# Patient Record
Sex: Male | Born: 1975 | Race: White | Hispanic: No | Marital: Single | Smoking: Current every day smoker
Health system: Southern US, Community
[De-identification: ages and names within clinical notes are randomized; demographics above are authoritative.]

---

## 1998-09-30 ENCOUNTER — Emergency Department (HOSPITAL_COMMUNITY): Admission: EM | Admit: 1998-09-30 | Discharge: 1998-09-30 | Payer: Self-pay | Admitting: Emergency Medicine

## 1998-12-20 ENCOUNTER — Emergency Department (HOSPITAL_COMMUNITY): Admission: EM | Admit: 1998-12-20 | Discharge: 1998-12-20 | Payer: Self-pay | Admitting: Emergency Medicine

## 2000-07-30 ENCOUNTER — Emergency Department (HOSPITAL_COMMUNITY): Admission: EM | Admit: 2000-07-30 | Discharge: 2000-07-30 | Payer: Self-pay | Admitting: *Deleted

## 2000-11-28 ENCOUNTER — Emergency Department (HOSPITAL_COMMUNITY): Admission: EM | Admit: 2000-11-28 | Discharge: 2000-11-28 | Payer: Self-pay | Admitting: Emergency Medicine

## 2000-11-29 ENCOUNTER — Emergency Department (HOSPITAL_COMMUNITY): Admission: EM | Admit: 2000-11-29 | Discharge: 2000-11-29 | Payer: Self-pay | Admitting: Emergency Medicine

## 2001-12-25 ENCOUNTER — Emergency Department (HOSPITAL_COMMUNITY): Admission: EM | Admit: 2001-12-25 | Discharge: 2001-12-25 | Payer: Self-pay | Admitting: Emergency Medicine

## 2002-03-01 ENCOUNTER — Emergency Department (HOSPITAL_COMMUNITY): Admission: EM | Admit: 2002-03-01 | Discharge: 2002-03-01 | Payer: Self-pay | Admitting: Emergency Medicine

## 2002-03-01 ENCOUNTER — Encounter: Payer: Self-pay | Admitting: Emergency Medicine

## 2002-03-14 ENCOUNTER — Emergency Department (HOSPITAL_COMMUNITY): Admission: EM | Admit: 2002-03-14 | Discharge: 2002-03-14 | Payer: Self-pay | Admitting: Emergency Medicine

## 2002-03-14 ENCOUNTER — Encounter: Payer: Self-pay | Admitting: Emergency Medicine

## 2004-02-06 ENCOUNTER — Emergency Department (HOSPITAL_COMMUNITY): Admission: EM | Admit: 2004-02-06 | Discharge: 2004-02-06 | Payer: Self-pay | Admitting: Emergency Medicine

## 2004-02-29 ENCOUNTER — Emergency Department (HOSPITAL_COMMUNITY): Admission: EM | Admit: 2004-02-29 | Discharge: 2004-02-29 | Payer: Self-pay | Admitting: Emergency Medicine

## 2004-05-31 ENCOUNTER — Emergency Department (HOSPITAL_COMMUNITY): Admission: EM | Admit: 2004-05-31 | Discharge: 2004-06-01 | Payer: Self-pay | Admitting: Emergency Medicine

## 2006-08-14 ENCOUNTER — Encounter: Admission: RE | Admit: 2006-08-14 | Discharge: 2006-08-14 | Payer: Self-pay | Admitting: Occupational Medicine

## 2007-07-13 ENCOUNTER — Emergency Department (HOSPITAL_COMMUNITY): Admission: EM | Admit: 2007-07-13 | Discharge: 2007-07-13 | Payer: Self-pay | Admitting: Emergency Medicine

## 2007-11-26 ENCOUNTER — Emergency Department (HOSPITAL_COMMUNITY): Admission: EM | Admit: 2007-11-26 | Discharge: 2007-11-26 | Payer: Self-pay | Admitting: Emergency Medicine

## 2008-06-06 ENCOUNTER — Emergency Department (HOSPITAL_COMMUNITY): Admission: EM | Admit: 2008-06-06 | Discharge: 2008-06-06 | Payer: Self-pay | Admitting: Emergency Medicine

## 2008-08-06 ENCOUNTER — Ambulatory Visit: Payer: Self-pay | Admitting: Diagnostic Radiology

## 2008-08-06 ENCOUNTER — Emergency Department (HOSPITAL_BASED_OUTPATIENT_CLINIC_OR_DEPARTMENT_OTHER): Admission: EM | Admit: 2008-08-06 | Discharge: 2008-08-06 | Payer: Self-pay | Admitting: Emergency Medicine

## 2008-08-17 ENCOUNTER — Emergency Department (HOSPITAL_COMMUNITY): Admission: EM | Admit: 2008-08-17 | Discharge: 2008-08-17 | Payer: Self-pay | Admitting: Emergency Medicine

## 2008-10-06 ENCOUNTER — Emergency Department (HOSPITAL_BASED_OUTPATIENT_CLINIC_OR_DEPARTMENT_OTHER): Admission: EM | Admit: 2008-10-06 | Discharge: 2008-10-06 | Payer: Self-pay | Admitting: Emergency Medicine

## 2009-11-18 ENCOUNTER — Emergency Department (HOSPITAL_COMMUNITY): Admission: EM | Admit: 2009-11-18 | Discharge: 2009-11-18 | Payer: Self-pay | Admitting: Emergency Medicine

## 2011-03-16 ENCOUNTER — Emergency Department (HOSPITAL_COMMUNITY)
Admission: EM | Admit: 2011-03-16 | Discharge: 2011-03-16 | Disposition: A | Discharge status: Home / Self Care | Payer: Self-pay | Attending: Emergency Medicine | Admitting: Emergency Medicine

## 2011-03-16 DIAGNOSIS — K029 Dental caries, unspecified: Secondary | ICD-10-CM | POA: Insufficient documentation

## 2011-03-16 DIAGNOSIS — K089 Disorder of teeth and supporting structures, unspecified: Secondary | ICD-10-CM | POA: Insufficient documentation

## 2011-08-23 ENCOUNTER — Encounter (HOSPITAL_COMMUNITY): Payer: Self-pay | Admitting: Emergency Medicine

## 2011-08-23 ENCOUNTER — Emergency Department (HOSPITAL_COMMUNITY): Payer: Self-pay

## 2011-08-23 ENCOUNTER — Other Ambulatory Visit: Payer: Self-pay

## 2011-08-23 ENCOUNTER — Emergency Department (HOSPITAL_COMMUNITY)
Admission: EM | Admit: 2011-08-23 | Discharge: 2011-08-23 | Disposition: A | Discharge status: Home / Self Care | Payer: Self-pay | Attending: Emergency Medicine | Admitting: Emergency Medicine

## 2011-08-23 DIAGNOSIS — J029 Acute pharyngitis, unspecified: Secondary | ICD-10-CM | POA: Insufficient documentation

## 2011-08-23 DIAGNOSIS — J069 Acute upper respiratory infection, unspecified: Secondary | ICD-10-CM | POA: Insufficient documentation

## 2011-08-23 DIAGNOSIS — R059 Cough, unspecified: Secondary | ICD-10-CM | POA: Insufficient documentation

## 2011-08-23 DIAGNOSIS — J3489 Other specified disorders of nose and nasal sinuses: Secondary | ICD-10-CM | POA: Insufficient documentation

## 2011-08-23 DIAGNOSIS — R0602 Shortness of breath: Secondary | ICD-10-CM | POA: Insufficient documentation

## 2011-08-23 DIAGNOSIS — F17200 Nicotine dependence, unspecified, uncomplicated: Secondary | ICD-10-CM | POA: Insufficient documentation

## 2011-08-23 DIAGNOSIS — R05 Cough: Secondary | ICD-10-CM | POA: Insufficient documentation

## 2011-08-23 MED ORDER — ALBUTEROL SULFATE (5 MG/ML) 0.5% IN NEBU
5.0000 mg | INHALATION_SOLUTION | Freq: Once | RESPIRATORY_TRACT | Status: AC
  Administered 2011-08-23: 5 mg via RESPIRATORY_TRACT
  Filled 2011-08-23: qty 1

## 2011-08-23 MED ORDER — AZITHROMYCIN 250 MG PO TABS: ORAL_TABLET | ORAL | Status: AC

## 2011-08-23 MED ORDER — IPRATROPIUM BROMIDE 0.02 % IN SOLN
0.5000 mg | Freq: Once | RESPIRATORY_TRACT | Status: AC
  Administered 2011-08-23: 0.5 mg via RESPIRATORY_TRACT
  Filled 2011-08-23: qty 2.5

## 2011-08-23 MED ORDER — ALBUTEROL SULFATE HFA 108 (90 BASE) MCG/ACT IN AERS
2.0000 | INHALATION_SPRAY | RESPIRATORY_TRACT | Status: DC | PRN
  Administered 2011-08-23: 2 via RESPIRATORY_TRACT
  Filled 2011-08-23: qty 6.7

## 2011-08-23 NOTE — ED Notes (Signed)
Pt here from home with c/o cough, sob and some chest pain, pt is a smoker

## 2011-08-23 NOTE — ED Provider Notes (Signed)
History     CSN: [id number]  Arrival date & time 08/23/11  0920   First MD Initiated Contact with Patient 08/23/11 0945      Chief Complaint  Patient presents with  . Shortness of Breath    (Consider location/radiation/quality/duration/timing/severity/associated sxs/prior treatment) Patient is a 36 y.o. male presenting with shortness of breath. The history is provided by the patient.  Shortness of Breath  The current episode started 3 to 5 days ago. The problem occurs frequently. The problem has been gradually worsening. The problem is moderate. The symptoms are aggravated by a supine position. Associated symptoms include rhinorrhea, sore throat, cough and shortness of breath. Pertinent negatives include no chest pain, no fever and no wheezing. There was no intake of a foreign body. Past medical history comments: Remote history of asthma as a child.Marland Kitchen    History reviewed. No pertinent past medical history.  History reviewed. No pertinent past surgical history.  History reviewed. No pertinent family history.  History  Substance Use Topics  . Smoking status: Current Everyday Smoker -- 1.0 packs/day  . Smokeless tobacco: Not on file  . Alcohol Use: No      Review of Systems  Constitutional: Positive for fatigue. Negative for fever and chills.       Complains of night sweats.  HENT: Positive for sore throat and rhinorrhea.   Respiratory: Positive for cough and shortness of breath. Negative for wheezing.   Cardiovascular: Negative.  Negative for chest pain.  Gastrointestinal: Negative.   Musculoskeletal: Negative.   Skin: Negative.   Neurological: Negative.     Allergies  Review of patient's allergies indicates no known allergies.  Home Medications   Current Outpatient Rx  Name Route Sig Dispense Refill  . NAPROXEN SODIUM 220 MG PO TABS Oral Take 440 mg by mouth daily as needed. For pain    . OVER THE COUNTER MEDICATION Oral Take 2 tablets by mouth 2 (two) times daily  as needed. Tylenol Cold & Flu Tablets  For cold & congestion      BP 140/97  Pulse 76  Temp(Src) 97.9 F (36.6 C) (Oral)  Resp 18  SpO2 98%  Physical Exam  Constitutional: He appears well-developed and well-nourished.  HENT:  Head: Normocephalic.  Neck: Normal range of motion. Neck supple.  Cardiovascular: Normal rate and regular rhythm.   Pulmonary/Chest: Effort normal and breath sounds normal. He exhibits no tenderness.  Abdominal: Soft. Bowel sounds are normal. There is no tenderness. There is no rebound and no guarding.  Musculoskeletal: Normal range of motion.  Neurological: He is alert. No cranial nerve deficit.  Skin: Skin is warm and dry. No rash noted.  Psychiatric: He has a normal mood and affect.    ED Course  Procedures (including critical care time) Feels better after breathing treatment. Has sinus tenderness, cough causing chest wall pain. Will treat with z-pack, encourage smoking cessation.  Labs Reviewed - No data to display Dg Chest 2 View  08/23/2011  *RADIOLOGY REPORT*  Clinical Data: Cough for 3 weeks.  Shortness of breath.  Chest tightness.  CHEST - 2 VIEW  Comparison: None.  Findings: Lungs are clear.  Heart size is normal.  No pneumothorax or pleural effusion.  No focal bony abnormality.  IMPRESSION: Negative chest.  Original Report Authenticated By: Bernadene Bell. Maricela Curet, M.D.     No diagnosis found.    MDM          Rodena Medin, PA-C 08/23/11 1141

## 2011-08-23 NOTE — ED Notes (Signed)
Discharge instructions reviewed with pt; verbalizes understanding.  No questions asked; no further c/o's voiced.  Ambulatory to lobby. NAD noted. 

## 2011-08-23 NOTE — ED Provider Notes (Signed)
Medical screening examination/treatment/procedure(s) were performed by non-physician practitioner and as supervising physician I was immediately available for consultation/collaboration.  Flint Melter, MD 08/23/11 1945

## 2011-12-25 ENCOUNTER — Emergency Department (HOSPITAL_COMMUNITY)
Admission: EM | Admit: 2011-12-25 | Discharge: 2011-12-25 | Disposition: A | Discharge status: Home / Self Care | Payer: Self-pay | Attending: Emergency Medicine | Admitting: Emergency Medicine

## 2011-12-25 ENCOUNTER — Encounter (HOSPITAL_COMMUNITY): Payer: Self-pay

## 2011-12-25 DIAGNOSIS — H9209 Otalgia, unspecified ear: Secondary | ICD-10-CM | POA: Insufficient documentation

## 2011-12-25 DIAGNOSIS — F17200 Nicotine dependence, unspecified, uncomplicated: Secondary | ICD-10-CM | POA: Insufficient documentation

## 2011-12-25 MED ORDER — NEOMYCIN-POLYMYXIN-HC 3.5-10000-1 OT SUSP: 3.0000 [drp] | Freq: Three times a day (TID) | OTIC | Status: AC

## 2011-12-25 NOTE — ED Notes (Signed)
Patient presents with right ear pain, dizziness and decrease in hearing x 5 days.  Patient has had a recent sinus infection.   OTC meds not working for pain.

## 2011-12-25 NOTE — ED Provider Notes (Signed)
History    This chart was scribed for Gerald Gaskins, MD, MD by Gerald Li. The patient was seen in room STRE4 and the patient's care was started at 6:13PM.   CSN: [id number]  Arrival date & time 12/25/11  1757   First MD Initiated Contact with Patient 12/25/11 1804      Chief Complaint  Patient presents with  . Otalgia     Patient is a 36 y.o. male presenting with ear pain. The history is provided by the patient.  Otalgia   Gerald Li is a 36 y.o. male who presents to the Emergency Department complaining of intermittent moderate right otalgia onset 5 days ago. Pt reports having nausea and dizziness. He states that he might have had fevers. Denies LOC. He reports the pain is sharp. He has tried OTC medication without relief. He reports having sinus infection and dental pain that he has had for a couple of weeks that might be the cause of his ear pain. There is no radiation of pain. 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  PMH - none  History reviewed. No pertinent past surgical history.  No family history on file.  History  Substance Use Topics  . Smoking status: Current Everyday Smoker -- 1.0 packs/day  . Smokeless tobacco: Not on file  . Alcohol Use: No      Review of Systems  Constitutional: Negative for fever.  HENT: Positive for ear pain.     Allergies  Review of patient's allergies indicates no known allergies.  Home Medications    BP 130/80  Pulse 92  Temp(Src) 98.2 F (36.8 C) (Oral)  Resp 16  SpO2 97%  Physical Exam  Nursing note and vitals reviewed.  CONSTITUTIONAL: Well developed/well nourished HEAD AND FACE: Normocephalic/atraumatic EYES: EOMI/PERRL ENMT: Mucous membranes moist, right tm occluded by cerumen and drainage, ears symmetric NECK: supple no meningeal signs CV: S1/S2 noted, no murmurs/rubs/gallops noted LUNGS: Lungs are  clear to auscultation bilaterally, no apparent distress ABDOMEN: soft, nontender, no rebound or guarding NEURO: Pt is awake/alert, moves all extremitiesx4 EXTREMITIES: pulses normal, full ROM SKIN: warm, color normal PSYCH: no abnormalities of mood noted  ED Course  Procedures DIAGNOSTIC STUDIES: Oxygen Saturation is 97% on room air, normal by my interpretation.    COORDINATION OF CARE: 6:15PM EDP discusses pt ED treatment with pt.   Given his pain, drainage, could have otitis externa, will start on topical abx.  Pt otherwise well appearing, no focal neuro deficits, he is nontoxic in appearance    MDM  Nursing notes including past medical history, social history and family history reviewed and considered in documentation   I personally performed the services described in this documentation, which was scribed in my presence. The recorded information has been reviewed and considered.         Gerald Gaskins, MD 12/25/11 Gerald Li

## 2012-05-14 ENCOUNTER — Emergency Department (HOSPITAL_COMMUNITY)
Admission: EM | Admit: 2012-05-14 | Discharge: 2012-05-14 | Disposition: A | Discharge status: Home / Self Care | Payer: Self-pay | Attending: Emergency Medicine | Admitting: Emergency Medicine

## 2012-05-14 ENCOUNTER — Encounter (HOSPITAL_COMMUNITY): Payer: Self-pay

## 2012-05-14 DIAGNOSIS — F17200 Nicotine dependence, unspecified, uncomplicated: Secondary | ICD-10-CM | POA: Insufficient documentation

## 2012-05-14 DIAGNOSIS — K089 Disorder of teeth and supporting structures, unspecified: Secondary | ICD-10-CM | POA: Insufficient documentation

## 2012-05-14 DIAGNOSIS — K0889 Other specified disorders of teeth and supporting structures: Secondary | ICD-10-CM

## 2012-05-14 MED ORDER — PENICILLIN V POTASSIUM 500 MG PO TABS: 500.0000 mg | ORAL_TABLET | Freq: Three times a day (TID) | ORAL | Status: DC

## 2012-05-14 MED ORDER — HYDROCODONE-ACETAMINOPHEN 5-500 MG PO TABS: 1.0000 | ORAL_TABLET | Freq: Four times a day (QID) | ORAL | Status: DC | PRN

## 2012-05-14 NOTE — ED Provider Notes (Signed)
History     CSN: [id number]  Arrival date & time 05/14/12  2049   First MD Initiated Contact with Patient 05/14/12 2130      Chief Complaint  Patient presents with  . Dental Pain    (Consider location/radiation/quality/duration/timing/severity/associated sxs/prior treatment) HPI  36 year old male presents complaining of dental pain. Patient reports that he has very poor dentition. He has been seen at the dental free clinic in the past and was recommended to have antibiotics if he would like to have his teeth pulled. He is scheduled to followup at the free dental clinic on November 12. However for the past 2 weeks he has been having increasing dental pain. Pain is worsened on his right upper jaw. Describe pain as a sharp and throbbing sensation worsening with eating drinking hot or cold water or with air exposure. Onset is gradual, intermittent, nonradiating, moderate in severity, worsening with eating.  he denies fever, chills, or rash.  patient is a smoker.  Patient has been taking a moderate amount of ibuprofen for pain which provide some relief.  History reviewed. No pertinent past medical history.  History reviewed. No pertinent past surgical history.  History reviewed. No pertinent family history.  History  Substance Use Topics  . Smoking status: Current Every Day Smoker -- 1.0 packs/day  . Smokeless tobacco: Not on file  . Alcohol Use: No      Review of Systems  Constitutional: Negative for fever.  HENT: Positive for dental problem. Negative for sore throat and trouble swallowing.   Skin: Negative for rash.  Neurological: Negative for numbness.    Allergies  Review of patient's allergies indicates no known allergies.  Home Medications   Current Outpatient Rx  Name Route Sig Dispense Refill  . IBUPROFEN 200 MG PO TABS Oral Take 800 mg by mouth every 6 (six) hours as needed. For pain; Dosage per pt      BP 132/93  Pulse 82  Temp 97.8 F (36.6 C) (Oral)   Resp 18  SpO2 97%  Physical Exam  Nursing note and vitals reviewed. Constitutional: He appears well-developed and well-nourished. No distress.  HENT:  Head: Normocephalic and atraumatic.  Right Ear: External ear normal.  Left Ear: External ear normal.  Nose: Nose normal.  Mouth/Throat: Oropharynx is clear and moist. No oropharyngeal exudate.       Poor dentition. Significant dental decay on left upper premolar, her left lower molar, and right upper canine.  Gingititis noted to L lower molar, ttp.  No evidence of abscess noted.   Eyes: Conjunctivae normal are normal.  Neck: Normal range of motion. Neck supple.  Cardiovascular: Normal rate and regular rhythm.   Pulmonary/Chest: Effort normal and breath sounds normal.  Lymphadenopathy:    He has no cervical adenopathy.  Neurological: He is alert.  Skin: Skin is warm. No rash noted.  Psychiatric: He has a normal mood and affect.    ED Course  Procedures (including critical care time)  Labs Reviewed - No data to display No results found.   No diagnosis found.  1. Dental pain  MDM  Pt with poor dental decay with increase dental pain.  No evidence of deep tissue infection amenable to I&D.  Is afebrile.  Will prescribe abx, pain meds and referral to dentist for further care.  Smoking cessation discussed.    BP 132/93  Pulse 82  Temp 97.8 F (36.6 C) (Oral)  Resp 18  SpO2 97%        Greta Doom  Laveda Norman, PA-C 05/14/12 2202

## 2012-05-14 NOTE — ED Notes (Signed)
Provider at bedside

## 2012-05-14 NOTE — ED Notes (Signed)
Pt reports pain to all of his upper and lower teeth w/increase pain to (L) upper area x2 weeks. Pt denies having a dentist, reports he usually attends the free dental clinic that is offered every year, they informed him last year he would need ABX treatment prior to having any teeth pulled this year to prevent further abscess d/t :heavy" dental carries.

## 2012-05-16 NOTE — ED Provider Notes (Signed)
Medical screening examination/treatment/procedure(s) were performed by non-physician practitioner and as supervising physician I was immediately available for consultation/collaboration.  Raeford Razor, MD 05/16/12 0040

## 2014-04-26 ENCOUNTER — Encounter (HOSPITAL_COMMUNITY): Payer: Self-pay | Admitting: Emergency Medicine

## 2014-04-26 ENCOUNTER — Emergency Department (HOSPITAL_COMMUNITY)
Admission: EM | Admit: 2014-04-26 | Discharge: 2014-04-26 | Disposition: A | Discharge status: Home / Self Care | Payer: Self-pay | Attending: Emergency Medicine | Admitting: Emergency Medicine

## 2014-04-26 ENCOUNTER — Emergency Department (HOSPITAL_COMMUNITY): Payer: Self-pay

## 2014-04-26 DIAGNOSIS — Z720 Tobacco use: Secondary | ICD-10-CM | POA: Insufficient documentation

## 2014-04-26 DIAGNOSIS — K047 Periapical abscess without sinus: Secondary | ICD-10-CM | POA: Insufficient documentation

## 2014-04-26 DIAGNOSIS — K029 Dental caries, unspecified: Secondary | ICD-10-CM | POA: Insufficient documentation

## 2014-04-26 DIAGNOSIS — Z792 Long term (current) use of antibiotics: Secondary | ICD-10-CM | POA: Insufficient documentation

## 2014-04-26 DIAGNOSIS — K0381 Cracked tooth: Secondary | ICD-10-CM | POA: Insufficient documentation

## 2014-04-26 MED ORDER — HYDROCODONE-ACETAMINOPHEN 5-500 MG PO TABS: 1.0000 | ORAL_TABLET | Freq: Four times a day (QID) | ORAL | Status: DC | PRN

## 2014-04-26 MED ORDER — PENICILLIN V POTASSIUM 500 MG PO TABS: 500.0000 mg | ORAL_TABLET | Freq: Four times a day (QID) | ORAL | Status: DC

## 2014-04-26 MED ORDER — IBUPROFEN 800 MG PO TABS: 800.0000 mg | ORAL_TABLET | Freq: Four times a day (QID) | ORAL | Status: DC | PRN

## 2014-04-26 NOTE — ED Notes (Signed)
Pt. reports left upper dental pain with swelling onset last week .

## 2014-04-26 NOTE — Discharge Instructions (Signed)
Follow-up with the dentist provided.  Return here as needed. 

## 2014-04-26 NOTE — ED Provider Notes (Signed)
CSN: [id number]     Arrival date & time 04/26/14  1904 History  This chart was scribed for non-physician practitioner, Ebbie Ridgehris Derrian Rodak, PA-C working with Raeford RazorStephen Kohut, MD by Greggory StallionKayla Andersen, ED scribe. This patient was seen in room TR05C/TR05C and the patient's care was started at 7:49 PM.   Chief Complaint  Patient presents with  . Dental Pain   The history is provided by the patient. No language interpreter was used.   HPI Comments: Vladimir CroftsDaniel W River is a 38 y.o. male who presents to the Emergency Department complaining of gradual onset left upper dental pain that started one week ago. Reports associated swelling around the area. There has been some drainage. He has rinsed with peroxide and water with little relief. Reports fever of 101 yesterday that was relieved with tylenol. Pt states he was waiting until the 16th to go to a free dental clinic. States he is also having substernal chest pain with inhalation.   History reviewed. No pertinent past medical history. History reviewed. No pertinent past surgical history. No family history on file. History  Substance Use Topics  . Smoking status: Current Every Day Smoker -- 1.00 packs/day  . Smokeless tobacco: Not on file  . Alcohol Use: No    Review of Systems All other systems negative except as documented in the HPI. All pertinent positives and negatives as reviewed in the HPI.  Allergies  Review of patient's allergies indicates no known allergies.  Home Medications   Prior to Admission medications   Medication Sig Start Date End Date Taking? Authorizing Provider  HYDROcodone-acetaminophen (VICODIN) 5-500 MG per tablet Take 1-2 tablets by mouth every 6 (six) hours as needed for pain. 05/14/12   Fayrene HelperBowie Tran, PA-C  ibuprofen (ADVIL,MOTRIN) 200 MG tablet Take 800 mg by mouth every 6 (six) hours as needed. For pain; Dosage per pt    Historical Provider, MD  penicillin v potassium (VEETID) 500 MG tablet Take 1 tablet (500 mg total) by mouth 3  (three) times daily. 05/14/12   Fayrene HelperBowie Tran, PA-C   BP 147/92  Pulse 86  Temp(Src) 98.7 F (37.1 C) (Oral)  Resp 18  Ht 5\' 10"  (1.778 m)  Wt 235 lb (106.595 kg)  BMI 33.72 kg/m2  SpO2 97%  Physical Exam  Nursing note and vitals reviewed. Constitutional: He is oriented to person, place, and time. He appears well-developed and well-nourished. No distress.  HENT:  Head: Normocephalic and atraumatic.  Multiple broken teeth off the gumline. Bad dental decay.   Neck: Normal range of motion. Neck supple. No tracheal deviation present.  Cardiovascular: Normal rate, regular rhythm and normal heart sounds.   Pulmonary/Chest: Effort normal and breath sounds normal. No respiratory distress. He has no wheezes. He has no rales.  Musculoskeletal: Normal range of motion. He exhibits no edema.  Neurological: He is alert and oriented to person, place, and time.  Skin: Skin is warm and dry.  Psychiatric: He has a normal mood and affect. His behavior is normal.    ED Course  Procedures (including critical care time)  DIAGNOSTIC STUDIES: Oxygen Saturation is 97% on RA, normal by my interpretation.    COORDINATION OF CARE: 7:51 PM-Discussed treatment plan which includes chest xray, pain medication and an antibiotic with pt at bedside and pt agreed to plan. Will give pt dental referrals and advised him to try to follow up before the dental clinic.   Labs Review Labs Reviewed - No data to display  Imaging Review Dg Chest 2  View  04/26/2014   CLINICAL DATA:  Cough and chest pain for 3 days.  Initial encounter.  EXAM: CHEST  2 VIEW  COMPARISON:  08/23/2011  FINDINGS: Normal heart size and mediastinal contours. No acute infiltrate or edema. No effusion or pneumothorax. No acute osseous findings.  IMPRESSION: No active cardiopulmonary disease.   Electronically Signed   By: Tiburcio PeaJonathan  Watts M.D.   On: 04/26/2014 21:00      I personally performed the services described in this documentation, which was  scribed in my presence. The recorded information has been reviewed and is accurate.  Carlyle Dollyhristopher W Asher Torpey, PA-C 04/26/14 2109

## 2014-04-29 NOTE — ED Provider Notes (Signed)
Medical screening examination/treatment/procedure(s) were performed by non-physician practitioner and as supervising physician I was immediately available for consultation/collaboration.   EKG Interpretation None       Errick Salts, MD 04/29/14 1904 

## 2014-10-21 ENCOUNTER — Emergency Department (HOSPITAL_BASED_OUTPATIENT_CLINIC_OR_DEPARTMENT_OTHER)
Admission: EM | Admit: 2014-10-21 | Discharge: 2014-10-21 | Disposition: A | Discharge status: Home / Self Care | Payer: Self-pay | Attending: Emergency Medicine | Admitting: Emergency Medicine

## 2014-10-21 ENCOUNTER — Encounter (HOSPITAL_BASED_OUTPATIENT_CLINIC_OR_DEPARTMENT_OTHER): Payer: Self-pay

## 2014-10-21 DIAGNOSIS — Z720 Tobacco use: Secondary | ICD-10-CM | POA: Insufficient documentation

## 2014-10-21 DIAGNOSIS — K088 Other specified disorders of teeth and supporting structures: Secondary | ICD-10-CM | POA: Insufficient documentation

## 2014-10-21 DIAGNOSIS — K0889 Other specified disorders of teeth and supporting structures: Secondary | ICD-10-CM

## 2014-10-21 DIAGNOSIS — K007 Teething syndrome: Secondary | ICD-10-CM | POA: Insufficient documentation

## 2014-10-21 MED ORDER — HYDROCODONE-ACETAMINOPHEN 5-325 MG PO TABS
2.0000 | ORAL_TABLET | Freq: Once | ORAL | Status: AC
  Administered 2014-10-21: 2 via ORAL
  Filled 2014-10-21: qty 2

## 2014-10-21 MED ORDER — IBUPROFEN 800 MG PO TABS: 800.0000 mg | ORAL_TABLET | Freq: Three times a day (TID) | ORAL | Status: AC

## 2014-10-21 MED ORDER — PENICILLIN V POTASSIUM 500 MG PO TABS: 500.0000 mg | ORAL_TABLET | Freq: Four times a day (QID) | ORAL | Status: AC

## 2014-10-21 MED ORDER — BUPIVACAINE-EPINEPHRINE (PF) 0.5% -1:200000 IJ SOLN
1.8000 mL | Freq: Once | INTRAMUSCULAR | Status: AC
  Administered 2014-10-21: 1.8 mL
  Filled 2014-10-21: qty 1.8

## 2014-10-21 MED ORDER — HYDROCODONE-ACETAMINOPHEN 5-325 MG PO TABS: 1.0000 | ORAL_TABLET | ORAL | Status: DC | PRN

## 2014-10-21 NOTE — ED Notes (Addendum)
C/o dental pain started last night-"abscess" x 2- pt with poor dental hygiene-broken decayed teeth noted throughout

## 2014-10-21 NOTE — ED Provider Notes (Signed)
CSN: [id number]     Arrival date & time 10/21/14  1503 History   First MD Initiated Contact with Patient 10/21/14 1940     Chief Complaint  Patient presents with  . Dental Pain     (Consider location/radiation/quality/duration/timing/severity/associated sxs/prior Treatment) HPI Gerald Li is a 39 y.o. male with chronic poor dentition comes in for evaluation of dental pain onset 2 days ago. Patient reports constant throbbing, aching pain that is severe in nature to his upper front and left canine teeth. He is taking ibuprofen at home without relief. He reports associated fevers, nausea and vomiting at home. He reports a fever today of 102F, but this resolved on its own prior to arrival. Rubbing his tongue, temperature changes exacerbate his pain. Nothing seems to improve his pain. No other aggravating or modifying factors. Reports he recently found a new job and will have dental insurance next month, but will need dental care in the ED until that time.  History reviewed. No pertinent past medical history. History reviewed. No pertinent past surgical history. No family history on file. History  Substance Use Topics  . Smoking status: Current Every Day Smoker -- 1.00 packs/day  . Smokeless tobacco: Not on file  . Alcohol Use: No    Review of Systems  Constitutional: Negative for fever.  HENT: Positive for dental problem.   Respiratory: Negative for shortness of breath.   Cardiovascular: Negative for chest pain.  Skin: Negative for rash.      Allergies  Review of patient's allergies indicates no known allergies.  Home Medications   Prior to Admission medications   Medication Sig Start Date End Date Taking? Authorizing Provider  HYDROcodone-acetaminophen (NORCO/VICODIN) 5-325 MG per tablet Take 1 tablet by mouth every 4 (four) hours as needed. 10/21/14   Joycie PeekBenjamin Callen Zuba, PA-C  ibuprofen (ADVIL,MOTRIN) 800 MG tablet Take 1 tablet (800 mg total) by mouth 3 (three) times daily.  10/21/14   Joycie PeekBenjamin Brogan England, PA-C  penicillin v potassium (VEETID) 500 MG tablet Take 1 tablet (500 mg total) by mouth 4 (four) times daily. 10/21/14 10/28/14  Joycie PeekBenjamin Dara Camargo, PA-C   BP 133/78 mmHg  Pulse 89  Temp(Src) 98.2 F (36.8 C) (Oral)  Resp 18  Ht 5\' 10"  (1.778 m)  Wt 235 lb (106.595 kg)  BMI 33.72 kg/m2  SpO2 99% Physical Exam  Constitutional:  Awake, alert, nontoxic appearance.  HENT:  Head: Atraumatic.  Discomfort located to recommended to superior right incisor and superior left premolar. Overall poor dentition. Multiple missing teeth with active caries. Mucous membranes are moist. No unilateral tonsillar swelling, uvula midline, no glossal swelling or elevation. No trismus. No fluctuance or evidence of a drainable abscess. No other evidence of emergent infection, Retropharyngeal or Peritonsillar abscess, Ludwig or Vincents angina. Tolerating secretions well. Patent airway   Eyes: Right eye exhibits no discharge. Left eye exhibits no discharge.  Neck: Neck supple.  Pulmonary/Chest: Effort normal. He exhibits no tenderness.  Abdominal: Soft. There is no tenderness. There is no rebound.  Musculoskeletal: He exhibits no tenderness.  Baseline ROM, no obvious new focal weakness.  Neurological:  Mental status and motor strength appears baseline for patient and situation.  Skin: No rash noted.  Psychiatric: He has a normal mood and affect.  Nursing note and vitals reviewed.   ED Course  Procedures (including critical care time) NERVE BLOCK Performed by: Sharlene Mottsartner, Deronte Solis W Consent: Verbal consent obtained. Required items: required blood products, implants, devices, and special equipment available Time out: Immediately prior to  procedure a "time out" was called to verify the correct patient, procedure, equipment, support staff and site/side marked as required.  Indication: dental pain Nerve block body site: superior left premolar  Preparation: Patient was prepped and  draped in the usual sterile fashion. Needle gauge: 24 G Location technique: anatomical landmarks  Local anesthetic: bupivacaine  Anesthetic total: 1.8 ml  Outcome: pain improved Patient tolerance: Patient tolerated the procedure well with no immediate complications.  Labs Review Labs Reviewed - No data to display  Imaging Review No results found.   EKG Interpretation None     Meds given in ED:  Medications  HYDROcodone-acetaminophen (NORCO/VICODIN) 5-325 MG per tablet 2 tablet (2 tablets Oral Given 10/21/14 2012)  bupivacaine-epinephrine (MARCAINE W/ EPI) 0.5% -1:200000 injection 1.8 mL (1.8 mLs Infiltration Given 10/21/14 2114)    Discharge Medication List as of 10/21/2014  9:18 PM    START taking these medications   Details  HYDROcodone-acetaminophen (NORCO/VICODIN) 5-325 MG per tablet Take 1 tablet by mouth every 4 (four) hours as needed., Starting 10/21/2014, Until Discontinued, Print    ibuprofen (ADVIL,MOTRIN) 800 MG tablet Take 1 tablet (800 mg total) by mouth 3 (three) times daily., Starting 10/21/2014, Until Discontinued, Print    penicillin v potassium (VEETID) 500 MG tablet Take 1 tablet (500 mg total) by mouth 4 (four) times daily., Starting 10/21/2014, Until Wed 10/28/14, Print       Filed Vitals:   10/21/14 1551 10/21/14 2125  BP: 147/90 133/78  Pulse: 81 89  Temp: 98.2 F (36.8 C)   TempSrc: Oral   Resp: 18 18  Height:  (1.778 m)   Weight: 235 lb (106.595 kg)   SpO2: 98% 99%    MDM  Vitals stable - WNL -afebrile Pt resting comfortably in ED. Pain improved with oral nerve block. Very poor dentition, however, Physical exam not concerning for Ludwig or vincents angina. No evidence of retropharyngeal or peritonsillar abscess. No glossal elevation, trismus. Tolerating secretions well. Patent airway.  Will DC with antibiotics, pain medicines. Instructions to follow-up with his dentist and primary care for further evaluation and management of his  symptoms. I discussed all relevant lab findings and imaging results with pt and they verbalized understanding. Discussed f/u with PCP within 48 hrs and return precautions, pt very amenable to plan.  Final diagnoses:  Pain, dental       Joycie Peek, PA-C 10/22/14 1052  Glynn Octave, MD 10/22/14 [phone]

## 2014-10-21 NOTE — Discharge Instructions (Signed)
Dental Pain °A tooth ache may be caused by cavities (tooth decay). Cavities expose the nerve of the tooth to air and hot or cold temperatures. It may come from an infection or abscess (also called a boil or furuncle) around your tooth. It is also often caused by dental caries (tooth decay). This causes the pain you are having. °DIAGNOSIS  °Your caregiver can diagnose this problem by exam. °TREATMENT  °· If caused by an infection, it may be treated with medications which kill germs (antibiotics) and pain medications as prescribed by your caregiver. Take medications as directed. °· Only take over-the-counter or prescription medicines for pain, discomfort, or fever as directed by your caregiver. °· Whether the tooth ache today is caused by infection or dental disease, you should see your dentist as soon as possible for further care. °SEEK MEDICAL CARE IF: °The exam and treatment you received today has been provided on an emergency basis only. This is not a substitute for complete medical or dental care. If your problem worsens or new problems (symptoms) appear, and you are unable to meet with your dentist, call or return to this location. °SEEK IMMEDIATE MEDICAL CARE IF:  °· You have a fever. °· You develop redness and swelling of your face, jaw, or neck. °· You are unable to open your mouth. °· You have severe pain uncontrolled by pain medicine. °MAKE SURE YOU:  °· Understand these instructions. °· Will watch your condition. °· Will get help right away if you are not doing well or get worse. °Document Released: 07/03/2005 Document Revised: 09/25/2011 Document Reviewed: 02/19/2008 °ExitCare® Patient Information ©2015 [employer]. This information is not intended to replace advice given to you by your health care provider. Make sure you discuss any questions you have with your health care provider. ° °Dental Care and Dentist Visits °Dental care supports good overall health. Regular dental visits can also help you  avoid dental pain, bleeding, infection, and other more serious health problems in the future. It is important to keep the mouth healthy because diseases in the teeth, gums, and other oral tissues can spread to other areas of the body. Some problems, such as diabetes, heart disease, and pre-term labor have been associated with poor oral health.  °See your dentist every 6 months. If you experience emergency problems such as a toothache or broken tooth, go to the dentist right away. If you see your dentist regularly, you may catch problems early. It is easier to be treated for problems in the early stages.  °WHAT TO EXPECT AT A DENTIST VISIT  °Your dentist will look for many common oral health problems and recommend proper treatment. At your regular dental visit, you can expect: °· Gentle cleaning of the teeth and gums. This includes scraping and polishing. This helps to remove the sticky substance around the teeth and gums (plaque). Plaque forms in the mouth shortly after eating. Over time, plaque hardens on the teeth as tartar. If tartar is not removed regularly, it can cause problems. Cleaning also helps remove stains. °· Periodic X-rays. These pictures of the teeth and supporting bone will help your dentist assess the health of your teeth. °· Periodic fluoride treatments. Fluoride is a natural mineral shown to help strengthen teeth. Fluoride treatment involves applying a fluoride gel or varnish to the teeth. It is most commonly done in children. °· Examination of the mouth, tongue, jaws, teeth, and gums to look for any oral health problems, such as: °¨ Cavities (dental caries). This is   decay on the tooth caused by plaque, sugar, and acid in the mouth. It is best to catch a cavity when it is small. °¨ Inflammation of the gums caused by plaque buildup (gingivitis). °¨ Problems with the mouth or malformed or misaligned teeth. °¨ Oral cancer or other diseases of the soft tissues or jaws.  °KEEP YOUR TEETH AND GUMS  HEALTHY °For healthy teeth and gums, follow these general guidelines as well as your dentist's specific advice: °· Have your teeth professionally cleaned at the dentist every 6 months. °· Brush twice daily with a fluoride toothpaste. °· Floss your teeth daily.  °· Ask your dentist if you need fluoride supplements, treatments, or fluoride toothpaste. °· Eat a healthy diet. Reduce foods and drinks with added sugar. °· Avoid smoking. °TREATMENT FOR ORAL HEALTH PROBLEMS °If you have oral health problems, treatment varies depending on the conditions present in your teeth and gums. °· Your caregiver will most likely recommend good oral hygiene at each visit. °· For cavities, gingivitis, or other oral health disease, your caregiver will perform a procedure to treat the problem. This is typically done at a separate appointment. Sometimes your caregiver will refer you to another dental specialist for specific tooth problems or for surgery. °SEEK IMMEDIATE DENTAL CARE IF: °· You have pain, bleeding, or soreness in the gum, tooth, jaw, or mouth area. °· A permanent tooth becomes loose or separated from the gum socket. °· You experience a blow or injury to the mouth or jaw area. °Document Released: 03/15/2011 Document Revised: 09/25/2011 Document Reviewed: 03/15/2011 °ExitCare® Patient Information ©2015 [employer]. This information is not intended to replace advice given to you by your health care provider. Make sure you discuss any questions you have with your health care provider. ° °

## 2014-10-21 NOTE — ED Notes (Signed)
Pt requested for me to add a note that he needs RTW note stating he was here

## 2015-02-14 ENCOUNTER — Encounter (HOSPITAL_BASED_OUTPATIENT_CLINIC_OR_DEPARTMENT_OTHER): Payer: Self-pay | Admitting: *Deleted

## 2015-02-14 ENCOUNTER — Emergency Department (HOSPITAL_BASED_OUTPATIENT_CLINIC_OR_DEPARTMENT_OTHER)
Admission: EM | Admit: 2015-02-14 | Discharge: 2015-02-14 | Disposition: A | Discharge status: Home / Self Care | Payer: Self-pay | Attending: Emergency Medicine | Admitting: Emergency Medicine

## 2015-02-14 DIAGNOSIS — K088 Other specified disorders of teeth and supporting structures: Secondary | ICD-10-CM | POA: Insufficient documentation

## 2015-02-14 DIAGNOSIS — Z791 Long term (current) use of non-steroidal anti-inflammatories (NSAID): Secondary | ICD-10-CM | POA: Insufficient documentation

## 2015-02-14 DIAGNOSIS — Z720 Tobacco use: Secondary | ICD-10-CM | POA: Insufficient documentation

## 2015-02-14 DIAGNOSIS — K0889 Other specified disorders of teeth and supporting structures: Secondary | ICD-10-CM

## 2015-02-14 DIAGNOSIS — K029 Dental caries, unspecified: Secondary | ICD-10-CM | POA: Insufficient documentation

## 2015-02-14 MED ORDER — AMOXICILLIN 500 MG PO CAPS: 500.0000 mg | ORAL_CAPSULE | Freq: Three times a day (TID) | ORAL | Status: DC

## 2015-02-14 MED ORDER — IBUPROFEN 800 MG PO TABS: 800.0000 mg | ORAL_TABLET | Freq: Three times a day (TID) | ORAL | Status: AC

## 2015-02-14 MED ORDER — IBUPROFEN 800 MG PO TABS
800.0000 mg | ORAL_TABLET | Freq: Once | ORAL | Status: AC
  Administered 2015-02-14: 800 mg via ORAL
  Filled 2015-02-14: qty 1

## 2015-02-14 NOTE — ED Notes (Addendum)
Left facial swelling and pain x 24 hours several broken teeth reports chills and nausea as well

## 2015-02-14 NOTE — Discharge Instructions (Signed)
Dental Pain °A tooth ache may be caused by cavities (tooth decay). Cavities expose the nerve of the tooth to air and hot or cold temperatures. It may come from an infection or abscess (also called a boil or furuncle) around your tooth. It is also often caused by dental caries (tooth decay). This causes the pain you are having. °DIAGNOSIS  °Your caregiver can diagnose this problem by exam. °TREATMENT  °· If caused by an infection, it may be treated with medications which kill germs (antibiotics) and pain medications as prescribed by your caregiver. Take medications as directed. °· Only take over-the-counter or prescription medicines for pain, discomfort, or fever as directed by your caregiver. °· Whether the tooth ache today is caused by infection or dental disease, you should see your dentist as soon as possible for further care. °SEEK MEDICAL CARE IF: °The exam and treatment you received today has been provided on an emergency basis only. This is not a substitute for complete medical or dental care. If your problem worsens or new problems (symptoms) appear, and you are unable to meet with your dentist, call or return to this location. °SEEK IMMEDIATE MEDICAL CARE IF:  °· You have a fever. °· You develop redness and swelling of your face, jaw, or neck. °· You are unable to open your mouth. °· You have severe pain uncontrolled by pain medicine. °MAKE SURE YOU:  °· Understand these instructions. °· Will watch your condition. °· Will get help right away if you are not doing well or get worse. °Document Released: 07/03/2005 Document Revised: 09/25/2011 Document Reviewed: 02/19/2008 °ExitCare® Patient Information ©2015 [employer]. This information is not intended to replace advice given to you by your health care provider. Make sure you discuss any questions you have with your health care provider. ° °Emergency Department Resource Guide °1) Find a Doctor and Pay Out of Pocket °Although you won't have to find out who  is covered by your insurance plan, it is a good idea to ask around and get recommendations. You will then need to call the office and see if the doctor you have chosen will accept you as a new patient and what types of options they offer for patients who are self-pay. Some doctors offer discounts or will set up payment plans for their patients who do not have insurance, but you will need to ask so you aren't surprised when you get to your appointment. ° °2) Contact Your Local Health Department °Not all health departments have doctors that can see patients for sick visits, but many do, so it is worth a call to see if yours does. If you don't know where your local health department is, you can check in your phone book. The CDC also has a tool to help you locate your state's health department, and many state websites also have listings of all of their local health departments. ° °3) Find a Walk-in Clinic °If your illness is not likely to be very severe or complicated, you may want to try a walk in clinic. These are popping up all over the country in pharmacies, drugstores, and shopping centers. They're usually staffed by nurse practitioners or physician assistants that have been trained to treat common illnesses and complaints. They're usually fairly quick and inexpensive. However, if you have serious medical issues or chronic medical problems, these are probably not your best option. ° °No Primary Care Doctor: °- Call [facility] at  832-8000 - they can help you locate a primary   care doctor that  accepts your insurance, provides certain services, etc. °- Physician Referral Service- 1-800-533-3463 ° °Chronic Pain Problems: °Organization         Address  Phone   Notes  °[facility]  (336) 297-2271 Patients need to be referred by their primary care doctor.  ° °Medication Assistance: °Organization         Address  Phone   Notes  °[place] [facility] [address]  [address] °[place], [place] [address] (336) 641-8030 --Must be a resident of [place] °-- Must have NO insurance coverage whatsoever (no Medicaid/ Medicare, etc.) °-- The pt. MUST have a primary care doctor that directs their care regularly and follows them in the community °  °[facility]  (866) 331-1348   °[employer]  (888) 892-1162   ° °Agencies that provide inexpensive medical care: °Organization         Address  Phone   Notes  °[facility]  (336) 832-8035   °[facility]    (336) 832-7272   °[facility] [address] °[place], [place] [address] (336) 832-4777   °[facility] [address], °[place] (336) 271-4999   °[facility]    (336) 373-0678   °[facility]    (336) 272-1050   °[facility] ° [address], [place] Phone:  (336) 832-4444, Fax:  (336) 832-4440 Hours of Operation:  9 am - 6 pm, M-F.  Also accepts Medicaid/Medicare and self-pay.  °[facility] ° [address], [place] Phone: (336) 832-3150, Fax: (336) [phone]. Hours of Operation:  8:30 am - 5:30 pm, M-F.  Also accepts Medicaid and self-pay.  °[facility] [place] [address], [place] Phone: (336) 878-6027   °[facility] [address], [place], [place] (336)723-1848, Ext. 123 Mondays & Thursdays: 7-9 AM.  First 15 patients are seen on a first come, first serve basis. °  ° °Medicaid-accepting [place] Providers: ° °Organization         Address  Phone   Notes  °[facility] [address], [place] (336) 641-2100 Also accepts self-pay patients.  °[facility] [address], [place] ° (336) 856-9996   °[facility] [address], [place] (336) 288-8857   °[facility] [address] [place] [address], [place] (336) 299-7000   °Veita Bland [address], [place]  ° (336) 373-1557 Only accepts [place] Access Medicaid patients after they have their name applied to their card.  ° °Self-Pay (no insurance) in [place]: ° °Organization         Address  Phone   Notes  °Sickle Cell Patients, [facility] [address], [place] (336) 832-1970   °[facility] [address], [place] (336) 832-4400   °[facility] ° [address], [place] (336) 992-4800   °[facility]/Dr. Osei-Bonsu ° [address] [place] [address], [place] or [address], [place] (336) 841-8500 Phone number for both [place] and [place] locations is the same.  °[facility] [address], [place] (336) 299-0000   °[facility] [address] [place] [address], [place] or [address] (336) 852-7530 °(336) 878-2260   °  [facility] [address], [place] (336) 350-1642, phone; (336) 294-5005, fax Sees patients 1st and 3rd Saturday of every month.  Must not qualify for public or private insurance (i.e. Medicaid, Medicare, [place] Health Choice, Veterans' Benefits) • Household income should be no more than 200% of the poverty level •The clinic cannot treat you if you are pregnant or think you are pregnant • Sexually transmitted diseases are not treated at the clinic.  ° ° °Dental Care: °Organization         Address  Phone  Notes  °[facility] [address], [place] (336) 641-6152 Accepts children up to age 21 who are enrolled in Medicaid or [place] Health Choice; pregnant women with a Medicaid card; and children who have applied for Medicaid or [place] Health Choice, but were declined, whose parents can pay a reduced fee at time of service.  °[facility] [place]  [address], [place] (336) 641-7733 Accepts children up to age 21 who are  enrolled in Medicaid or [place] Health Choice; pregnant women with a Medicaid card; and children who have applied for Medicaid or [place] Health Choice, but were declined, whose parents can pay a reduced fee at time of service.  °[facility] ° [address], [place] (336) 641-4533 Patients are seen by appointment only. Walk-ins are not accepted. [facility] will see patients 18 years of age and older. °Monday - Tuesday (8am-5pm) °Most Wednesdays (8:30-5pm) °$30 per visit, cash only  °[facility] ° [address], [place] (336) 641-4533 Patients are seen by appointment only. Walk-ins are not accepted. [facility] will see patients 18 years of age and older. °One Wednesday Evening (Monthly: Volunteer Based).  $30 per visit, cash only  °[facility]  (919) 537-3737 for adults; Children under age 4, call Graduate Pediatric Dentistry at ([phone]) [phone]. Children aged 4-14, please call (919) [phone] to request a pediatric application. ° Dental services are provided in all areas of dental care including fillings, crowns and bridges, complete and partial dentures, implants, gum treatment, root canals, and extractions. Preventive care is also provided. Treatment is provided to both adults and children. °Patients are selected via a lottery and there is often a waiting list. °  °[facility] [address], °[place] ° (336) 763-8833 [email or web address] °  °[facility] [address], [place], [place] (336)723-1848, Ext. 123 Second and Fourth Thursday of each month, opens at 6:30 AM; Clinic ends at 9 AM.  Patients are seen on a first-come first-served basis, and a limited number are seen during each clinic.  ° °[facility] ° [address], [place], [place] (336) 723-7904   Eligibility Requirements °You must have lived in [place], [place], or [place] counties for at least the last three months. °  You  cannot be eligible for state or federal sponsored healthcare insurance, including Veterans Administration, Medicaid, or Medicare. °  You generally cannot be eligible for healthcare insurance through your employer.  °  How to apply: °Eligibility screenings are held every Tuesday and Wednesday afternoon from 1:00 pm until 4:00 pm. You do not need an appointment for the interview!  °[facility] [address], [place], [place] 336-631-2330   °[facility]  336-342-8273   °[facility]  336-703-3100   °[facility]  336-570-6415   ° °Behavioral Health Resources in the Community: °Intensive Outpatient Programs °Organization         Address  Phone  Notes  °[facility] [address], [place], [place] 336-878-6098   °[facility] [address], [place], [place] 336-832-9800   °[facility] [address], [place], [place] ° 336-882-2125   °[facility] [address],  °[place], [place] 1-800-853-5163 or 336-641-4981   °Substance Abuse Resources °Organization         Address  Phone  Notes  °[facility]  336-882-2125   °[facility]  336-784-9470   °[facility]  336-285-9073   °[facility]  336-845-3988   °Residential & Outpatient Substance Abuse Program  1-800-659-3381   °Psychological Services °Organization         Address  Phone  Notes  ° Health  336- 832-9600   °[facility]  336- 378-7881   °[facility] [address], [place] 1-800-853-5163 or 336-641-4981   ° °Mobile Crisis Teams °Organization         Address  Phone  Notes  °[facility], Mobile Crisis Care Unit  1-877-626-1772   °Assertive °[facility] ° [address] [place], [place] 336-834-9664   °Sharon DeEsch [address] °[place] 336-554-5454   ° °Self-Help/Support  Groups °Organization         Address  Phone             Notes  °[facility] - variety of support groups  336- 373-1402 Call for more information  °Narcotics Anonymous (NA), [facility] [address], °[place]  2 meetings at this location  ° °Residential Treatment Programs °Organization         Address  Phone  Notes  °[facility] [address],    °[place]  1-866-801-8205   °[facility] ° [address], [place], [place] 704-293-8524   °[facility] [address], [place] 336-845-3988 Admissions: 8am-3pm M-F  °[facility] [address]    °[place], [place] 336-841-1104   °[facility] [address], [place], [place] 336-379-7146   °[facility] [address]  °[place], [place] 336-285-9073   °[facility] Intensive Outpatient [address], [place], [place] 336-852-3033   °[facility] [address]  °[place], [place] 1-877-615-2722 or 336-784-9470   °[facility] [address] [place], [place] 336-227-7417 Accepts Medicaid  °[facility] [address]  ° [place] 1-800-659-3381 Substance Abuse/Addiction Treatment  ° °[place] Behavioral Health Resources °Organization         Address  Phone  Notes  °[facility]  (888) 581-9988   °Julie Brannon, PhD [address] [place], [place]   (336) 349-5553 or (336) 951-0000   °[facility]   [address] °[place], [place] (336) 349-4454   °[facility] [address], [place], [place] (336) 342-8316 Insurance/Medicaid/sponsorship through [facility]  °[facility] [address]                                    [place], [place] (336) 342-8316 Therapy/tele-psych/case  °[facility]   [address]  ° [place], [place] (336) 349-2233    °Dr. Arfeen  (336) 349-4544   °[facility] [place]  [facility] [place] [facility] 1) [address], [place] °2) [address], [place] °3)  [address], [place] (336) 349-3220 °(336) 342-7768 ° °(336) 342-8140   °[place] [facility] (336) 342-1394 or (336) 342-3537 (After Hours)    ° ° ° °

## 2015-02-14 NOTE — ED Provider Notes (Signed)
CSN: [id number]     Arrival date & time 02/14/15  1115 History   First MD Initiated Contact with Patient 02/14/15 1210     Chief Complaint  Patient presents with  . Facial Swelling  . Dental Pain     (Consider location/radiation/quality/duration/timing/severity/associated sxs/prior Treatment) Patient is a 39 y.o. male presenting with tooth pain. The history is provided by the patient. No language interpreter was used.  Dental Pain Location:  Generalized Quality:  Aching Severity:  Moderate Onset quality:  Gradual Duration:  2 days Timing:  Constant Progression:  Worsening Chronicity:  New Previous work-up:  Dental exam Worsened by:  Nothing tried Ineffective treatments:  None tried Associated symptoms: facial pain   Associated symptoms: no congestion   Risk factors: no diabetes     History reviewed. No pertinent past medical history. History reviewed. No pertinent past surgical history. History reviewed. No pertinent family history. History  Substance Use Topics  . Smoking status: Current Every Day Smoker -- 1.00 packs/day  . Smokeless tobacco: Not on file  . Alcohol Use: No    Review of Systems  HENT: Negative for congestion.   All other systems reviewed and are negative.     Allergies  Review of patient's allergies indicates no known allergies.  Home Medications   Prior to Admission medications   Medication Sig Start Date End Date Taking? Authorizing Provider  ibuprofen (ADVIL,MOTRIN) 800 MG tablet Take 1 tablet (800 mg total) by mouth 3 (three) times daily. 10/21/14  Yes Joycie Peek, PA-C  HYDROcodone-acetaminophen (NORCO/VICODIN) 5-325 MG per tablet Take 1 tablet by mouth every 4 (four) hours as needed. 10/21/14   Benjamin Cartner, PA-C   BP 160/101 mmHg  Pulse 83  Temp(Src) 99.2 F (37.3 C) (Oral)  Resp 18  Ht  (1.778 m)  Wt 235 lb (106.595 kg)  BMI 33.72 kg/m2  SpO2 99% Physical Exam  Constitutional: He is oriented to person, place, and  time. He appears well-developed and well-nourished.  HENT:  Head: Normocephalic and atraumatic.  Right Ear: External ear normal.  Left Ear: External ear normal.  Nose: Nose normal.  Severe dental decay erythematous gums  Eyes: Conjunctivae and EOM are normal. Pupils are equal, round, and reactive to light.  Neck: Normal range of motion.  Cardiovascular: Normal rate and regular rhythm.   Pulmonary/Chest: Effort normal and breath sounds normal.  Abdominal: Soft. He exhibits no distension.  Musculoskeletal: Normal range of motion.  Neurological: He is alert and oriented to person, place, and time.  Psychiatric: He has a normal mood and affect.  Nursing note and vitals reviewed.   ED Course  Procedures (including critical care time) Labs Review Labs Reviewed - No data to display  Imaging Review No results found.   EKG Interpretation None      MDM   Final diagnoses:  Toothache    amoxicillian Ibuprofen Resource guide    [place], [place] 02/14/15 1749

## 2015-02-15 ENCOUNTER — Emergency Department (HOSPITAL_BASED_OUTPATIENT_CLINIC_OR_DEPARTMENT_OTHER)
Admission: EM | Admit: 2015-02-15 | Discharge: 2015-02-15 | Disposition: A | Discharge status: Home / Self Care | Payer: Self-pay | Attending: Emergency Medicine | Admitting: Emergency Medicine

## 2015-02-15 ENCOUNTER — Encounter (HOSPITAL_BASED_OUTPATIENT_CLINIC_OR_DEPARTMENT_OTHER): Payer: Self-pay | Admitting: *Deleted

## 2015-02-15 DIAGNOSIS — Z791 Long term (current) use of non-steroidal anti-inflammatories (NSAID): Secondary | ICD-10-CM | POA: Insufficient documentation

## 2015-02-15 DIAGNOSIS — K029 Dental caries, unspecified: Secondary | ICD-10-CM | POA: Insufficient documentation

## 2015-02-15 DIAGNOSIS — Z720 Tobacco use: Secondary | ICD-10-CM | POA: Insufficient documentation

## 2015-02-15 DIAGNOSIS — K047 Periapical abscess without sinus: Secondary | ICD-10-CM | POA: Insufficient documentation

## 2015-02-15 MED ORDER — CLINDAMYCIN HCL 300 MG PO CAPS: 300.0000 mg | ORAL_CAPSULE | Freq: Four times a day (QID) | ORAL | Status: DC

## 2015-02-15 MED ORDER — HYDROCODONE-ACETAMINOPHEN 5-325 MG PO TABS: 1.0000 | ORAL_TABLET | ORAL | Status: AC | PRN

## 2015-02-15 MED ORDER — BUPIVACAINE-EPINEPHRINE (PF) 0.5% -1:200000 IJ SOLN
1.8000 mL | Freq: Once | INTRAMUSCULAR | Status: DC
  Filled 2015-02-15: qty 1.8

## 2015-02-15 NOTE — Discharge Instructions (Signed)
Take Vicodin for severe pain only. No driving or operating heavy machinery while taking vicodin. This medication may cause drowsiness. Begin taking clindamycin today as prescribed. Use no longer need to take amoxicillin. Follow-up with the dentist. This is very important.  Dental Abscess A dental abscess is a collection of infected fluid (pus) from a bacterial infection in the inner part of the tooth (pulp). It usually occurs at the end of the tooth's root.  CAUSES   Severe tooth decay.  Trauma to the tooth that allows bacteria to enter into the pulp, such as a broken or chipped tooth. SYMPTOMS   Severe pain in and around the infected tooth.  Swelling and redness around the abscessed tooth or in the mouth or face.  Tenderness.  Pus drainage.  Bad breath.  Bitter taste in the mouth.  Difficulty swallowing.  Difficulty opening the mouth.  Nausea.  Vomiting.  Chills.  Swollen neck glands. DIAGNOSIS   A medical and dental history will be taken.  An examination will be performed by tapping on the abscessed tooth.  X-rays may be taken of the tooth to identify the abscess. TREATMENT The goal of treatment is to eliminate the infection. You may be prescribed antibiotic medicine to stop the infection from spreading. A root canal may be performed to save the tooth. If the tooth cannot be saved, it may be pulled (extracted) and the abscess may be drained.  HOME CARE INSTRUCTIONS  Only take over-the-counter or prescription medicines for pain, fever, or discomfort as directed by your caregiver.  Rinse your mouth (gargle) often with salt water ( tsp salt in 8 oz [250 ml] of warm water) to relieve pain or swelling.  Do not drive after taking pain medicine (narcotics).  Do not apply heat to the outside of your face.  Return to your dentist for further treatment as directed. SEEK MEDICAL CARE IF:  Your pain is not helped by medicine.  Your pain is getting worse instead of  better. SEEK IMMEDIATE MEDICAL CARE IF:  You have a fever or persistent symptoms for more than 2-3 days.  You have a fever and your symptoms suddenly get worse.  You have chills or a very bad headache.  You have problems breathing or swallowing.  You have trouble opening your mouth.  You have swelling in the neck or around the eye. Document Released: 07/03/2005 Document Revised: 03/27/2012 Document Reviewed: 10/11/2010 [facility] Patient Information 2015 [place], [place]. This information is not intended to replace advice given to you by your health care provider. Make sure you discuss any questions you have with your health care provider.

## 2015-02-15 NOTE — ED Provider Notes (Signed)
CSN: [id number]     Arrival date & time 02/15/15  1459 History   First MD Initiated Contact with Patient 02/15/15 1510     Chief Complaint  Patient presents with  . Dental Problem     (Consider location/radiation/quality/duration/timing/severity/associated sxs/prior Treatment) HPI Comments: 39 year old male presenting with worsening swelling to the left side of his face after being seen in the ED yesterday. He was diagnosed with a toothache and started on amoxicillin and ibuprofen. Patient reports he woke up this morning and the swelling to his face had increased along with pain. Reports increased pain when biting down on anything described as sharp and stabbing, radiating throughout his mouth. Denies fevers. He tried calling the dentist on the discharge papers who told him he could not be seen until the swelling subsided.  The history is provided by the patient.    History reviewed. No pertinent past medical history. History reviewed. No pertinent past surgical history. No family history on file. History  Substance Use Topics  . Smoking status: Current Every Day Smoker -- 1.00 packs/day  . Smokeless tobacco: Not on file  . Alcohol Use: No    Review of Systems  HENT: Positive for dental problem and facial swelling.   All other systems reviewed and are negative.     Allergies  Review of patient's allergies indicates no known allergies.  Home Medications   Prior to Admission medications   Medication Sig Start Date End Date Taking? Authorizing Provider  clindamycin (CLEOCIN) 300 MG capsule Take 1 capsule (300 mg total) by mouth 4 (four) times daily. X 7 days 02/15/15   Kathrynn Speed, PA-C  HYDROcodone-acetaminophen (NORCO/VICODIN) 5-325 MG per tablet Take 1-2 tablets by mouth every 4 (four) hours as needed. 02/15/15   Anyla Israelson M Jhair Witherington, PA-C  ibuprofen (ADVIL,MOTRIN) 800 MG tablet Take 1 tablet (800 mg total) by mouth 3 (three) times daily. 10/21/14   Joycie Peek, PA-C  ibuprofen  (ADVIL,MOTRIN) 800 MG tablet Take 1 tablet (800 mg total) by mouth 3 (three) times daily. 02/14/15   Lonia Skinner Sofia, PA-C   BP 150/93 mmHg  Pulse 88  Temp(Src) 97.8 F (36.6 C) (Oral)  Resp 18  Ht 5\' 10"  (1.778 m)  Wt 235 lb (106.595 kg)  BMI 33.72 kg/m2  SpO2 99% Physical Exam  Constitutional: He is oriented to person, place, and time. He appears well-developed and well-nourished. No distress.  HENT:  Head: Normocephalic and atraumatic.  Poor dentition. Multiple dental caries, missing teeth and significant tooth decay. 1 cm area of fluctuance over gingiva of tooth #10. No trismus.  Eyes: Conjunctivae and EOM are normal.  Neck: Normal range of motion. Neck supple.  Cardiovascular: Normal rate, regular rhythm and normal heart sounds.   Pulmonary/Chest: Effort normal and breath sounds normal.  Musculoskeletal: Normal range of motion. He exhibits no edema.  Neurological: He is alert and oriented to person, place, and time.  Skin: Skin is warm and dry.  Psychiatric: He has a normal mood and affect. His behavior is normal.  Nursing note and vitals reviewed.   ED Course  NERVE BLOCK Date/Time: 02/15/2015 3:40 PM Performed by: Kathrynn Speed Authorized by: Kathrynn Speed Consent: Verbal consent obtained. Risks and benefits: risks, benefits and alternatives were discussed Consent given by: patient Patient understanding: patient states understanding of the procedure being performed Patient consent: the patient's understanding of the procedure matches consent given Patient identity confirmed: verbally with patient and arm band Time out: Immediately prior to procedure  a "time out" was called to verify the correct patient, procedure, equipment, support staff and site/side marked as required. Indications: pain relief Indications comments: I&D Body area: face/mouth Nerve: anterior superior alveolar Laterality: left Patient sedated: no Preparation: Patient was prepped and draped in the  usual sterile fashion. Patient position: sitting Needle gauge: 25 G Location technique: anatomical landmarks Local anesthetic: bupivacaine 0.5% with epinephrine Anesthetic total: 1.8 ml Outcome: pain improved Patient tolerance: Patient tolerated the procedure well with no immediate complications   INCISION AND DRAINAGE Performed by: Celene Skeen Consent: Verbal consent obtained. Risks and benefits: risks, benefits and alternatives were discussed Type: abscess  Body area: mouth  Anesthesia: dental nerve block  Incision was made with a 18 G needle.  Complexity: simple  Drainage: purulent  Drainage amount: large  Packing material: none  Patient tolerance: Patient tolerated the procedure well with no immediate complications.    (including critical care time) Labs Review Labs Reviewed - No data to display  Imaging Review No results found.   EKG Interpretation None      MDM   Final diagnoses:  Dental abscess   NAD. AFVSS. Abscess drained. Will switch patient to clindamycin. Discontinue amoxicillin. Vicodin for pain. No signs or symptoms of Ludwig angina. No trismus. Swallows secretions well. Stressed importance of dental follow-up. Stable for discharge. Return precautions given. Patient states understanding of treatment care plan and is agreeable.   Kathrynn Speed, PA-C 02/15/15 1550  Jerelyn Scott, MD 02/15/15 [phone]

## 2015-02-15 NOTE — ED Notes (Signed)
Facial swelling. Was treated for abscess tooth yesterday and given Rx for PCN and told to make a dental appointment.

## 2015-02-16 ENCOUNTER — Emergency Department (HOSPITAL_BASED_OUTPATIENT_CLINIC_OR_DEPARTMENT_OTHER)
Admission: EM | Admit: 2015-02-16 | Discharge: 2015-02-16 | Disposition: A | Discharge status: Home / Self Care | Payer: Self-pay | Attending: Emergency Medicine | Admitting: Emergency Medicine

## 2015-02-16 ENCOUNTER — Encounter (HOSPITAL_BASED_OUTPATIENT_CLINIC_OR_DEPARTMENT_OTHER): Payer: Self-pay | Admitting: *Deleted

## 2015-02-16 DIAGNOSIS — Z792 Long term (current) use of antibiotics: Secondary | ICD-10-CM | POA: Insufficient documentation

## 2015-02-16 DIAGNOSIS — K047 Periapical abscess without sinus: Secondary | ICD-10-CM

## 2015-02-16 DIAGNOSIS — H539 Unspecified visual disturbance: Secondary | ICD-10-CM | POA: Insufficient documentation

## 2015-02-16 DIAGNOSIS — Z791 Long term (current) use of non-steroidal anti-inflammatories (NSAID): Secondary | ICD-10-CM | POA: Insufficient documentation

## 2015-02-16 DIAGNOSIS — Z720 Tobacco use: Secondary | ICD-10-CM | POA: Insufficient documentation

## 2015-02-16 NOTE — Discharge Instructions (Signed)
Dental Abscess Continue your present medications. Keep your scheduled appointment with your dentist in 6 days. Your blood pressure today was elevated at 160/105. Your blood pressure should be rechecked in a week. Call the [facility] to get a primary care physician. Return if your condition worsens for any reason. A dental abscess is a collection of infected fluid (pus) from a bacterial infection in the inner part of the tooth (pulp). It usually occurs at the end of the tooth's root.  CAUSES   Severe tooth decay.  Trauma to the tooth that allows bacteria to enter into the pulp, such as a broken or chipped tooth. SYMPTOMS   Severe pain in and around the infected tooth.  Swelling and redness around the abscessed tooth or in the mouth or face.  Tenderness.  Pus drainage.  Bad breath.  Bitter taste in the mouth.  Difficulty swallowing.  Difficulty opening the mouth.  Nausea.  Vomiting.  Chills.  Swollen neck glands. DIAGNOSIS   A medical and dental history will be taken.  An examination will be performed by tapping on the abscessed tooth.  X-rays may be taken of the tooth to identify the abscess. TREATMENT The goal of treatment is to eliminate the infection. You may be prescribed antibiotic medicine to stop the infection from spreading. A root canal may be performed to save the tooth. If the tooth cannot be saved, it may be pulled (extracted) and the abscess may be drained.  HOME CARE INSTRUCTIONS  Only take over-the-counter or prescription medicines for pain, fever, or discomfort as directed by your caregiver.  Rinse your mouth (gargle) often with salt water ( tsp salt in 8 oz [250 ml] of warm water) to relieve pain or swelling.  Do not drive after taking pain medicine (narcotics).  Do not apply heat to the outside of your face.  Return to your dentist for further treatment as directed. SEEK MEDICAL CARE IF:  Your pain is not helped  by medicine.  Your pain is getting worse instead of better. SEEK IMMEDIATE MEDICAL CARE IF:  You have a fever or persistent symptoms for more than 2-3 days.  You have a fever and your symptoms suddenly get worse.  You have chills or a very bad headache.  You have problems breathing or swallowing.  You have trouble opening your mouth.  You have swelling in the neck or around the eye. Document Released: 07/03/2005 Document Revised: 03/27/2012 Document Reviewed: 10/11/2010 [facility] Patient Information 2015 [place], [place]. This information is not intended to replace advice given to you by your health care provider. Make sure you discuss any questions you have with your health care provider.

## 2015-02-16 NOTE — ED Provider Notes (Signed)
CSN: [id number]     Arrival date & time 02/16/15  1003 History   First MD Initiated Contact with Patient 02/16/15 1104     Chief Complaint  Patient presents with  . Dental Pain     (Consider location/radiation/quality/duration/timing/severity/associated sxs/prior Treatment) HPI Complains of pain at upper teeth for prostate the past one week. Evaluated the past 2 days. Had gingival abscess incised and drained yesterday around tooth number 13 area and has been treated with ibuprofen, initially treated with amoxicillin, switched to clindamycin yesterday. He presents today as he had increased facial swelling on the left side of his face and blurred vision this morning. Vision has returned to almost normal. He has no pain with extraocular movement. No other associated symptoms. Pain is well-controlled since he treated himself with the Vicodin this morning. Nothing makes symptoms better or worse. Patient has dental follow-up appointment in 6 days History reviewed. No pertinent past medical history. past medical history negative History reviewed. No pertinent past surgical history. History reviewed. No pertinent family history. History  Substance Use Topics  . Smoking status: Current Every Day Smoker -- 1.00 packs/day  . Smokeless tobacco: Not on file  . Alcohol Use: No   no illicit drug use  Review of Systems  Constitutional: Positive for chills.  HENT: Positive for dental problem and facial swelling.   Eyes: Positive for visual disturbance.  Respiratory: Negative.   Cardiovascular: Negative.   Gastrointestinal: Negative.   Musculoskeletal: Negative.   Skin: Negative.   Neurological: Negative.   Psychiatric/Behavioral: Negative.   All other systems reviewed and are negative.   Visual acuity 2020 right eye 20/30 left eye  Allergies  Review of patient's allergies indicates no known allergies.  Home Medications   Prior to Admission medications   Medication Sig Start Date End Date  Taking? Authorizing Provider  clindamycin (CLEOCIN) 300 MG capsule Take 1 capsule (300 mg total) by mouth 4 (four) times daily. X 7 days 02/15/15   Kathrynn Speed, PA-C  HYDROcodone-acetaminophen (NORCO/VICODIN) 5-325 MG per tablet Take 1-2 tablets by mouth every 4 (four) hours as needed. 02/15/15   Robyn M Hess, PA-C  ibuprofen (ADVIL,MOTRIN) 800 MG tablet Take 1 tablet (800 mg total) by mouth 3 (three) times daily. 10/21/14   Joycie Peek, PA-C  ibuprofen (ADVIL,MOTRIN) 800 MG tablet Take 1 tablet (800 mg total) by mouth 3 (three) times daily. 02/14/15   Elson Areas, PA-C   BP 146/101 mmHg  Pulse 82  Temp(Src) 98.4 F (36.9 C) (Oral)  Resp 18  Ht 5\' 10"  (1.778 m)  Wt 235 lb (106.595 kg)  BMI 33.72 kg/m2  SpO2 97% Physical Exam  Constitutional: He is oriented to person, place, and time. He appears well-developed and well-nourished. No distress.  HENT:  Head: Normocephalic and atraumatic.  Minimal swelling left infraorbital area.. No fluctuance of gingiva. No trismus. Generally poor dentition  Eyes: Conjunctivae and EOM are normal. Pupils are equal, round, and reactive to light. Right eye exhibits no discharge. Left eye exhibits no discharge.  No exophthalmos no pain on extraocular movement eyes appear normal. No supple conjunctival erythema  Neck: Neck supple. No tracheal deviation present. No thyromegaly present.  Cardiovascular: Normal rate and regular rhythm.   No murmur heard. Pulmonary/Chest: Effort normal and breath sounds normal.  Abdominal: Soft. Bowel sounds are normal. He exhibits no distension. There is no tenderness.  Obese  Musculoskeletal: Normal range of motion. He exhibits no edema or tenderness.  Neurological: He is alert and  oriented to person, place, and time. No cranial nerve deficit. Coordination normal.  Gait normal  Skin: Skin is warm and dry. No rash noted.  Psychiatric: He has a normal mood and affect.  Nursing note and vitals reviewed.   ED Course   Procedures (including critical care time) Labs Review Labs Reviewed - No data to display  Imaging Review No results found.   EKG Interpretation None     12 noon patient resting comfortably. He looked himself in the near and stated at left facial swelling is markedly improved over earlier this morning MDM  Plan continue present medications. Keep scheduled plan with dentist in 6 days Blood pressure recheck one week. Final diagnoses:  None   diagnoses #1 dental abscess #2 elevated blood pressure      Doug Sou, MD 02/16/15 1206

## 2015-02-16 NOTE — ED Notes (Signed)
Pt complains of dental abscess  Has been several times for same.but dentist,which he has an appointment with states he can't do anything until infection is gone.  Alert and oriented does have some puffiness under left eye,abscess is on the left also

## 2015-02-16 NOTE — ED Notes (Signed)
Pt amb to triage with quick steady gait in nad. Pt reports tooth pain x sat, worse today.

## 2015-06-22 IMAGING — CR DG CHEST 2V
2 series · 2 of 2 positions shown · non-contrast
Comparison: 08/23/2011

CLINICAL DATA: Cough and chest pain for 3 days.  Initial encounter.

EXAM:
CHEST  2 VIEW

[w chest pa]
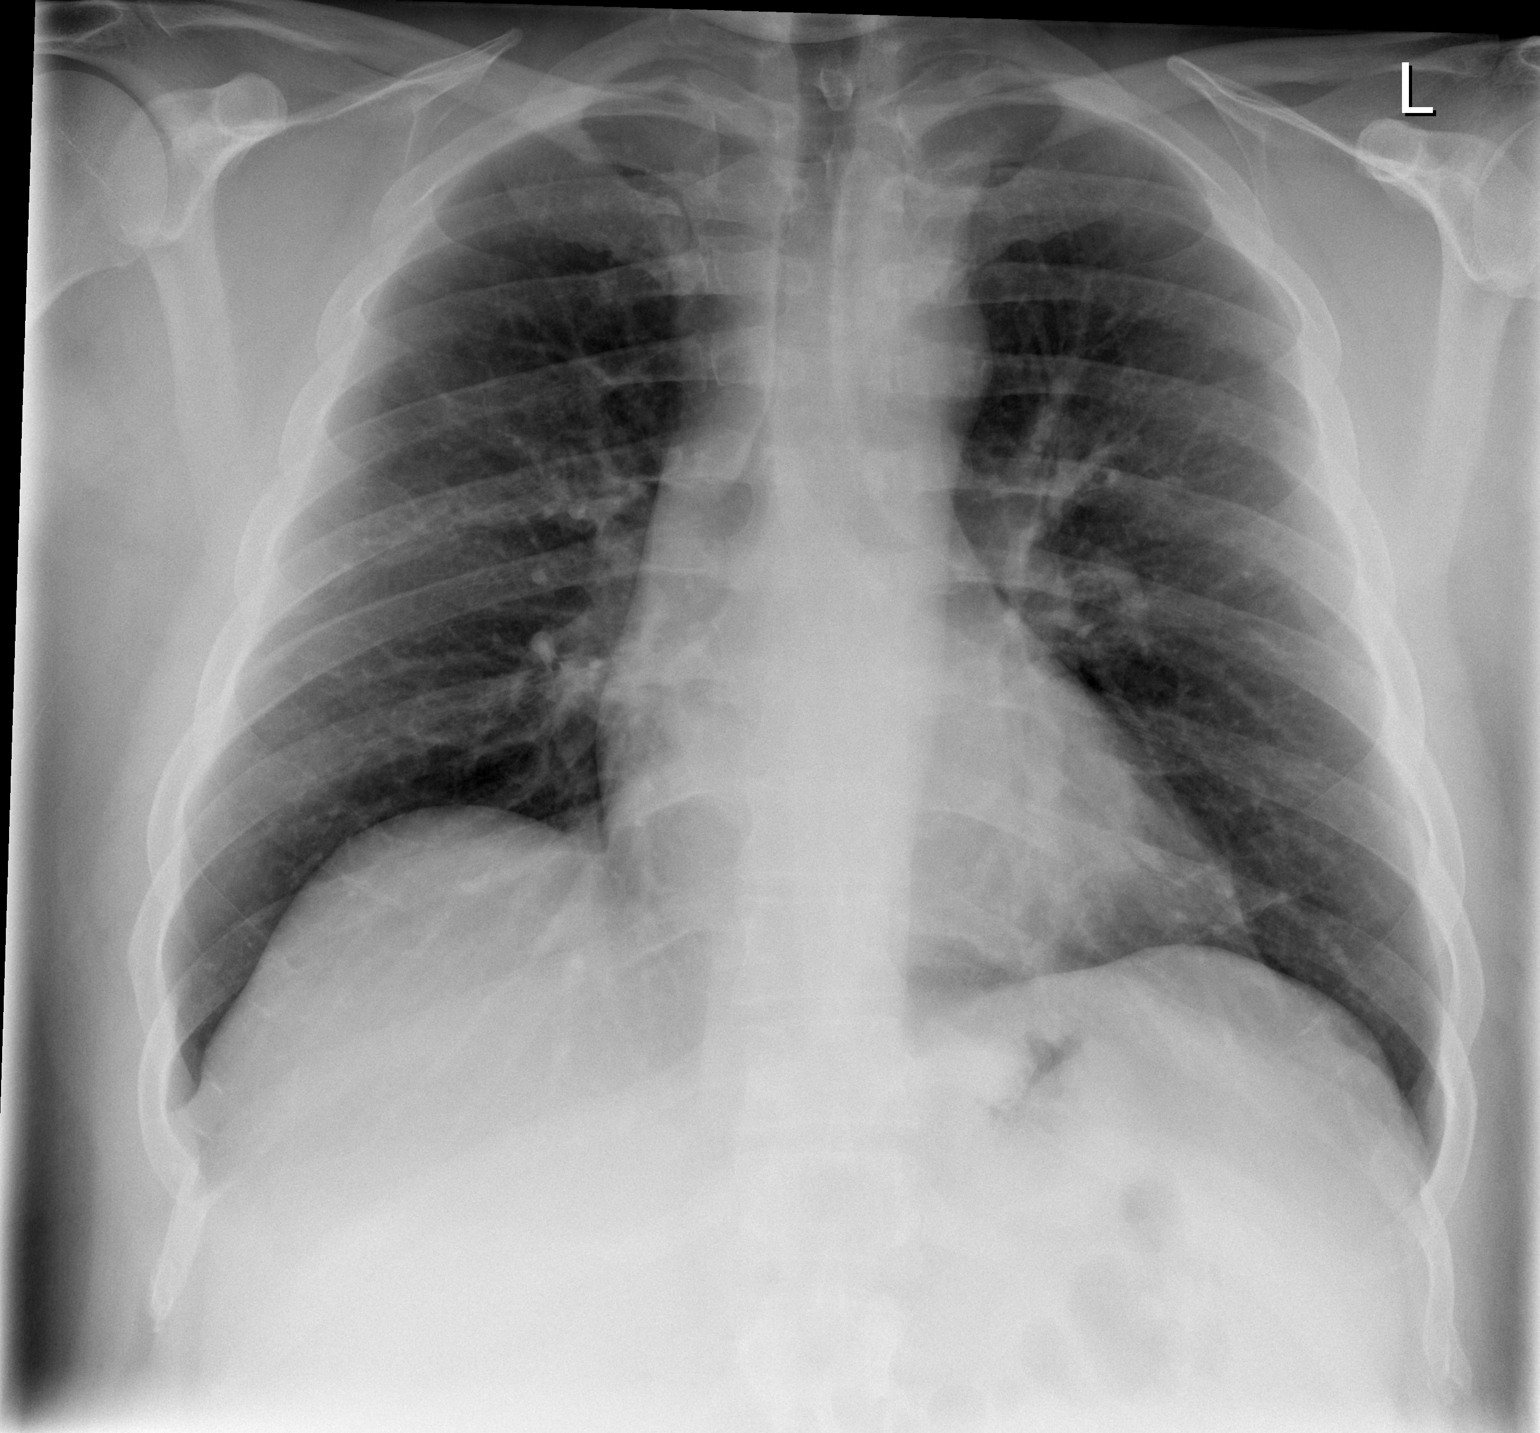

[w chest lat]
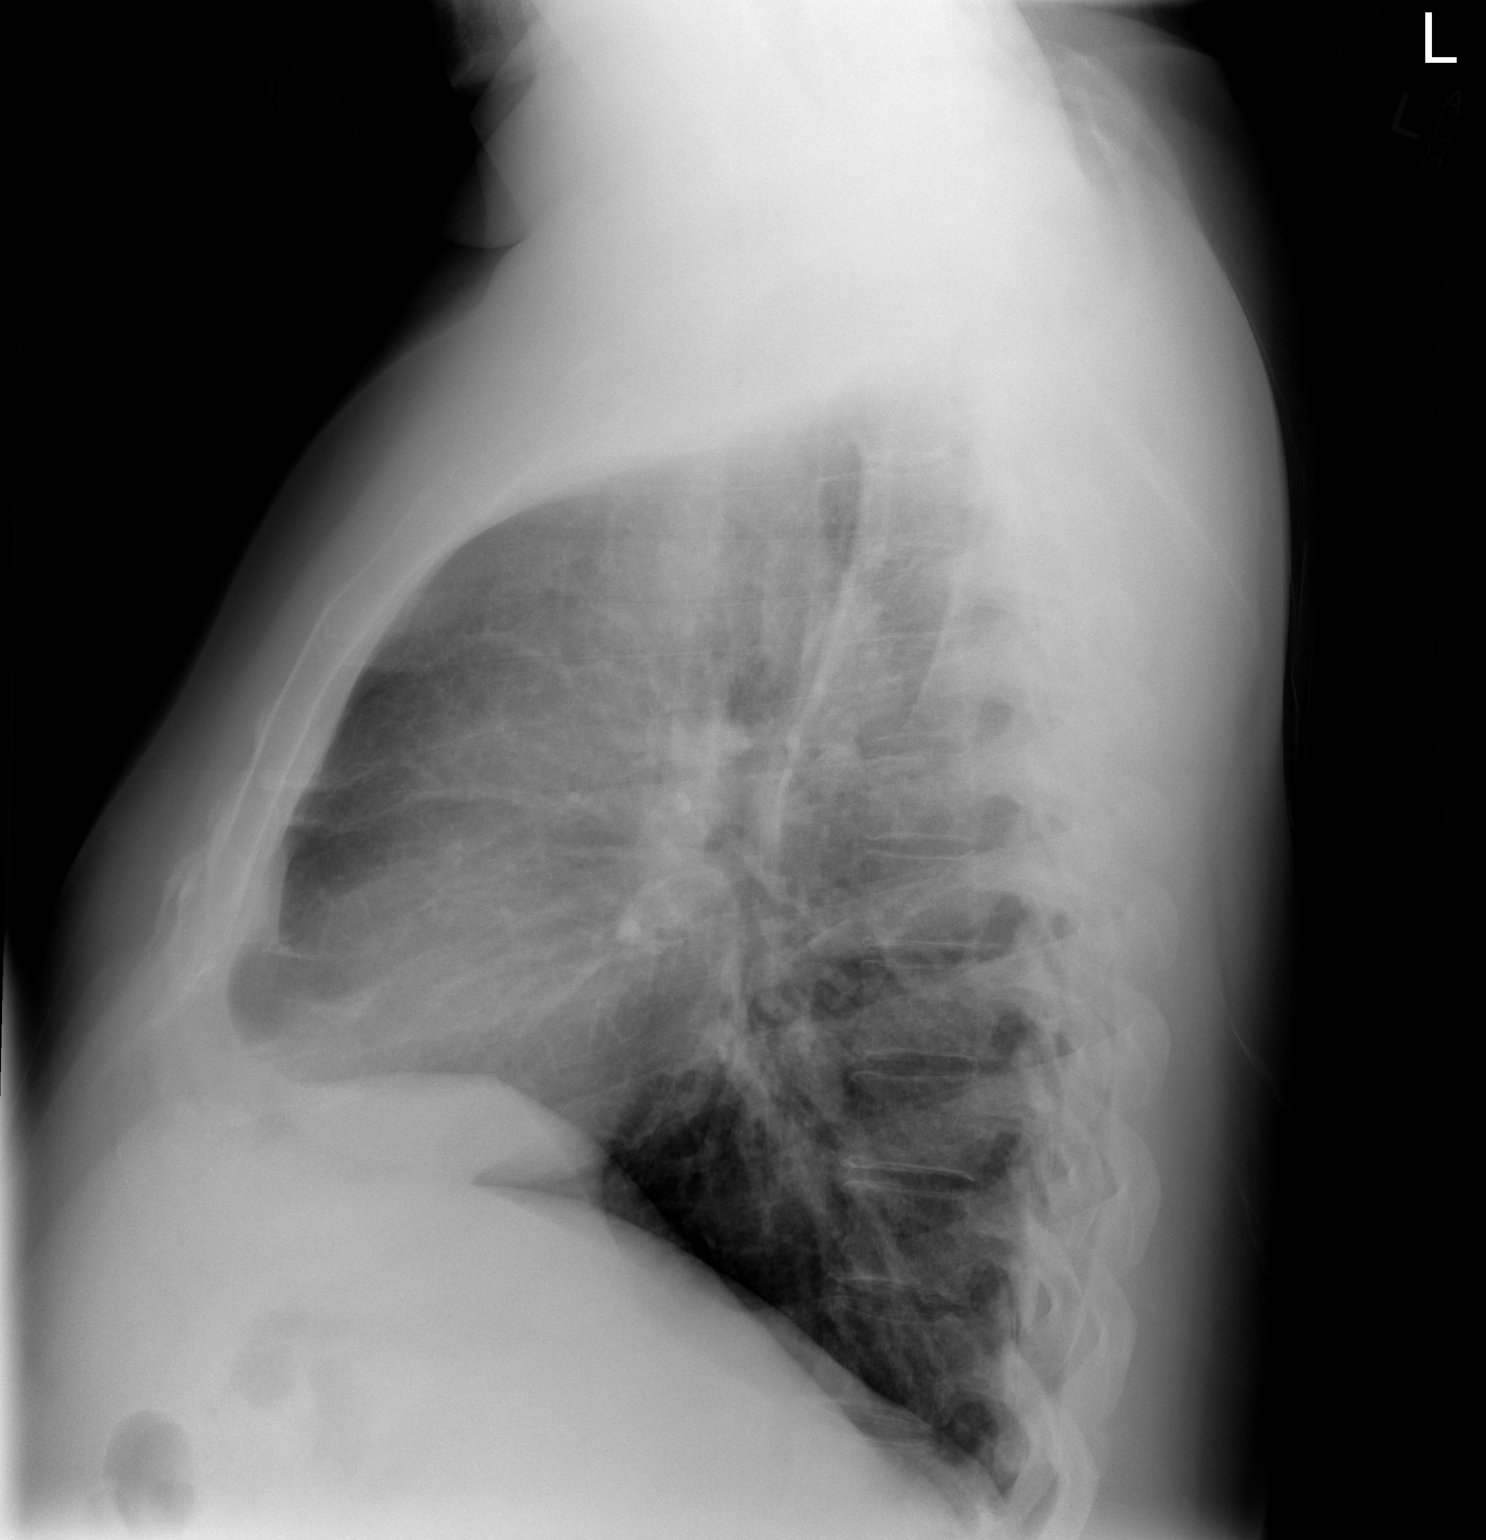

[2 of 2 positions shown; findings below may reference images not displayed]

FINDINGS: Normal heart size and mediastinal contours. No acute infiltrate or
edema. No effusion or pneumothorax. No acute osseous findings.
IMPRESSION: No active cardiopulmonary disease.

## 2023-07-14 ENCOUNTER — Other Ambulatory Visit: Payer: Self-pay

## 2023-07-14 ENCOUNTER — Encounter (HOSPITAL_COMMUNITY): Payer: Self-pay

## 2023-07-14 ENCOUNTER — Emergency Department (HOSPITAL_COMMUNITY)
Admission: EM | Admit: 2023-07-14 | Discharge: 2023-07-14 | Disposition: A | Discharge status: Home / Self Care | Payer: Self-pay | Attending: Emergency Medicine | Admitting: Emergency Medicine

## 2023-07-14 DIAGNOSIS — M5412 Radiculopathy, cervical region: Secondary | ICD-10-CM | POA: Insufficient documentation

## 2023-07-14 MED ORDER — LIDOCAINE 5 % EX PTCH: 1.0000 | MEDICATED_PATCH | CUTANEOUS | 0 refills | Status: AC

## 2023-07-14 MED ORDER — METHOCARBAMOL 500 MG PO TABS: 500.0000 mg | ORAL_TABLET | Freq: Four times a day (QID) | ORAL | 0 refills | Status: AC | PRN

## 2023-07-14 MED ORDER — MELOXICAM 7.5 MG PO TABS: 7.5000 mg | ORAL_TABLET | Freq: Every day | ORAL | 0 refills | Status: AC

## 2023-07-14 MED ORDER — PREDNISONE 20 MG PO TABS: 40.0000 mg | ORAL_TABLET | Freq: Every day | ORAL | 0 refills | Status: AC

## 2023-07-14 NOTE — ED Triage Notes (Signed)
Pt arrives with pain to right side of neck into shoulder and arm. Pt reports pain is burning. Pain has been ongoing for a month.

## 2023-07-14 NOTE — ED Provider Notes (Cosign Needed)
[facility]MENT AT [facility] Provider Note   CSN: [id number] Arrival date & time: 07/14/23  1501     History  Chief Complaint  Patient presents with   Neck Pain    Gerald Li is a 47 y.o. male.  He denies any significant PMH but does not follow closely with primary care.  Presents ER for several weeks of right posterior trapezius pain that feels like a pinched nerve, now the pain is radiating down into the right arm all the way to his hand.  No chest pain or shortness of breath, no nausea or vomiting, did not have injury or trauma.   Neck Pain      Home Medications Prior to Admission medications   Medication Sig Start Date End Date Taking? Authorizing Provider  lidocaine (LIDODERM) 5 % Place 1 patch onto the skin daily. Remove & Discard patch within 12 hours or as directed by MD 07/14/23  Yes Cristi Loron, Omkar Stratmann A, PA-C  meloxicam (MOBIC) 7.5 MG tablet Take 1 tablet (7.5 mg total) by mouth daily. 07/14/23  Yes Sonyia Muro A, PA-C  methocarbamol (ROBAXIN) 500 MG tablet Take 1 tablet (500 mg total) by mouth every 6 (six) hours as needed for muscle spasms. 07/14/23  Yes Annesha Delgreco A, PA-C  predniSONE (DELTASONE) 20 MG tablet Take 2 tablets (40 mg total) by mouth daily for 5 days. 07/14/23 07/19/23 Yes Jazae Gandolfi A, PA-C  clindamycin (CLEOCIN) 300 MG capsule Take 1 capsule (300 mg total) by mouth 4 (four) times daily. X 7 days 02/15/15   Hess, Nada Boozer, PA-C  HYDROcodone-acetaminophen (NORCO/VICODIN) 5-325 MG per tablet Take 1-2 tablets by mouth every 4 (four) hours as needed. 02/15/15   Hess, Nada Boozer, PA-C  ibuprofen (ADVIL,MOTRIN) 800 MG tablet Take 1 tablet (800 mg total) by mouth 3 (three) times daily. 10/21/14   Cartner, Sharlet Salina, PA-C  ibuprofen (ADVIL,MOTRIN) 800 MG tablet Take 1 tablet (800 mg total) by mouth 3 (three) times daily. 02/14/15   Elson Areas, PA-C      Allergies    Patient has no known allergies.    Review of Systems    Review of Systems  Musculoskeletal:  Positive for neck pain.    Physical Exam Updated Vital Signs BP (!) 171/108 (BP Location: Right Arm)   Pulse 73   Temp 98.5 F (36.9 C) (Oral)   Resp 18   Ht 5\' 11"  (1.803 m)   Wt 106.6 kg   SpO2 100%   BMI 32.78 kg/m  Physical Exam Vitals and nursing note reviewed.  Constitutional:      General: He is not in acute distress.    Appearance: He is well-developed.  HENT:     Head: Normocephalic and atraumatic.     Mouth/Throat:     Mouth: Mucous membranes are moist.  Eyes:     Conjunctiva/sclera: Conjunctivae normal.  Neck:     Comments: Tenderness to the right trapezius.  Range of motion intact, pain with rotation of neck to the right.  Positive Spurling test. Cardiovascular:     Rate and Rhythm: Normal rate and regular rhythm.     Heart sounds: No murmur heard. Pulmonary:     Effort: Pulmonary effort is normal. No respiratory distress.     Breath sounds: Normal breath sounds.  Abdominal:     Palpations: Abdomen is soft.     Tenderness: There is no abdominal tenderness.  Musculoskeletal:        General: No  swelling.     Cervical back: Neck supple.     Comments: Strength in bilateral upper and lower extremities 5 out of 5  Skin:    General: Skin is warm and dry.     Capillary Refill: Capillary refill takes less than 2 seconds.  Neurological:     General: No focal deficit present.     Mental Status: He is alert and oriented to person, place, and time.     Comments: Slight decrease sensation to light touch on right upper extremity in the distribution of C7 nerve root  Psychiatric:        Mood and Affect: Mood normal.     ED Results / Procedures / Treatments   Labs (all labs ordered are listed, but only abnormal results are displayed) Labs Reviewed - No data to display  EKG None  Radiology No results found.  Procedures Procedures    Medications Ordered in ED Medications - No data to display  ED Course/ Medical  Decision Making/ A&P                                 Medical Decision Making Differential diagnosis clues but not limited to muscle strain, contusion, HNP, degenerative disc disease, radiculopathy, other ED course: Patient here with neck pain rating down right arm.  Been on but has gotten worse.  Described as burning with some numbness.  The burning and numbness are in the distribution of the C7 nerve root.  Excellent strength in his extremities.  No saddle anesthesia or paresthesia, no bowel or bladder incontinence, no fevers or chills.  Patient is not diabetic, he has no history of IV drug abuse, not having systemic symptoms I do not suspect paraspinous or epidural abscess.  Discussed with patient we will have him follow-up closely as an outpatient with PCP and possibly neurosurgery if not improving will treat him with supportive measures such as steroids, NSAIDs and muscle relaxers.  He states he has been doing a lot of exercise trying to "stretch" but has been doing push-ups and dips.  Discussed that this could potentially be aggravating him, he is welcome to do some gentle stretching but would avoid these weightbearing exercises until inflammation has subsided and symptoms are improving, low back is able to do these exercises is reassuring. Pt noted to have very high blood pressure today. They have no symptoms, including no chest pain, SOB, vision change, numbness/tingling/weakness. They were made aware of the value and the need to follow up with primary care in 24-48 hours, and to return immediately if they develop any symptoms.    Amount and/or Complexity of Data Reviewed External Data Reviewed: notes.  Risk Prescription drug management.           Final Clinical Impression(s) / ED Diagnoses Final diagnoses:  Cervical radiculopathy    Rx / DC Orders ED Discharge Orders          Ordered    predniSONE (DELTASONE) 20 MG tablet  Daily        07/14/23 1717    lidocaine  (LIDODERM) 5 %  Every 24 hours        07/14/23 1717    methocarbamol (ROBAXIN) 500 MG tablet  Every 6 hours PRN        07/14/23 1717    meloxicam (MOBIC) 7.5 MG tablet  Daily        07/14/23 1719  Ma Rings, [place] 07/14/23 1726

## 2023-07-14 NOTE — Discharge Instructions (Addendum)
Is a pleasure taking care of you today.  You were seen in the ER for neck pain radiating down the right shoulder.  Symptoms are consistent with a pinched nerve in your neck.  We are going to treat you with steroids, anti-inflammatories, and muscle relaxers.  You are also given a prescription for topical lidocaine patches.  Please follow-up with primary care.  Is important for you to establish care.  Your blood pressure here was elevated today.  This may be in part due to your discomfort but you need to follow-up closely for a recheck.  Come back to the ER for any new or worsening symptoms.   Muscle relaxers can make you sleepy, do not drive or work when taking these.  So gave you follow-up for the neurosurgery office who you need to see if your neck pain is not getting better.

## 2024-01-26 ENCOUNTER — Other Ambulatory Visit: Payer: Self-pay

## 2024-01-26 ENCOUNTER — Emergency Department (HOSPITAL_COMMUNITY)
Admission: EM | Admit: 2024-01-26 | Discharge: 2024-01-26 | Disposition: A | Discharge status: Home / Self Care | Payer: Self-pay | Attending: Emergency Medicine | Admitting: Emergency Medicine

## 2024-01-26 ENCOUNTER — Encounter (HOSPITAL_COMMUNITY): Payer: Self-pay | Admitting: *Deleted

## 2024-01-26 DIAGNOSIS — L03213 Periorbital cellulitis: Secondary | ICD-10-CM | POA: Insufficient documentation

## 2024-01-26 DIAGNOSIS — H1089 Other conjunctivitis: Secondary | ICD-10-CM | POA: Insufficient documentation

## 2024-01-26 DIAGNOSIS — H1031 Unspecified acute conjunctivitis, right eye: Secondary | ICD-10-CM

## 2024-01-26 DIAGNOSIS — H00011 Hordeolum externum right upper eyelid: Secondary | ICD-10-CM | POA: Insufficient documentation

## 2024-01-26 MED ORDER — AMOXICILLIN-POT CLAVULANATE 875-125 MG PO TABS: 1.0000 | ORAL_TABLET | Freq: Two times a day (BID) | ORAL | 0 refills | Status: AC

## 2024-01-26 MED ORDER — ERYTHROMYCIN 5 MG/GM OP OINT: TOPICAL_OINTMENT | OPHTHALMIC | 0 refills | Status: AC

## 2024-01-26 MED ORDER — TETRACAINE HCL 0.5 % OP SOLN
2.0000 [drp] | Freq: Once | OPHTHALMIC | Status: AC
  Administered 2024-01-26: 2 [drp] via OPHTHALMIC
  Filled 2024-01-26: qty 4

## 2024-01-26 MED ORDER — FLUORESCEIN SODIUM 1 MG OP STRP
1.0000 | ORAL_STRIP | Freq: Once | OPHTHALMIC | Status: AC
  Administered 2024-01-26: 1 via OPHTHALMIC
  Filled 2024-01-26: qty 1

## 2024-01-26 NOTE — ED Triage Notes (Signed)
 C/o right eye pain states every morning he has manually open hsi eye, swollen and painful, denies injury

## 2024-01-26 NOTE — ED Notes (Signed)
 Di visual acuity patient was 20/20 left eye 20/20 right eye 20/20 both eyes

## 2024-01-26 NOTE — Discharge Instructions (Signed)
 It was a pleasure taking care of you here today  As we discussed I have done oral antibiotics as well as drops.  Make sure to follow-up with an eye doctor Monday.  Continue to keep doing warm compresses to the eye, Tylenol  Motrin   Follow-up outpatient, return for any worsening symptoms

## 2024-01-26 NOTE — ED Provider Notes (Signed)
 [place] EMERGENCY DEPARTMENT AT [facility] Provider Note   CSN: [id number] Arrival date & time: 01/26/24  1320    Patient presents with: Eye Problem   Gerald Li is a 48 y.o. male here for evaluation of right eyelid pain and swelling.  States over the last week he has noted redness and swelling to his upper eyelid.  Trying warm compress without relief.  Subsequently last few days he has noted yellow drainage and crusting to his eyelid.  He denies any recent injury or trauma.  No eye pain itself.  No photosensitivity.  No redness to eye.  No changes in vision.  States he is now starting get some redness to the inferior aspect of his eyelid.  He has no pain with eye movements.  He does not wear glasses or contacts.  No recent URI symptoms, headache, numbness, weakness.   HPI     Prior to Admission medications   Medication Sig Start Date End Date Taking? Authorizing Provider  amoxicillin -clavulanate (AUGMENTIN ) 875-125 MG tablet Take 1 tablet by mouth every 12 (twelve) hours. 01/26/24  Yes Levander Katzenstein A, PA-C  erythromycin  ophthalmic ointment Place a 1/2 inch ribbon of ointment into the lower eyelid. 01/26/24  Yes Carter Kassel A, PA-C  HYDROcodone -acetaminophen  (NORCO/VICODIN) 5-325 MG per tablet Take 1-2 tablets by mouth every 4 (four) hours as needed. 02/15/15   Hess, Catheryn HERO, PA-C  ibuprofen  (ADVIL ,MOTRIN ) 800 MG tablet Take 1 tablet (800 mg total) by mouth 3 (three) times daily. 10/21/14   Cartner, Morene, PA-C  ibuprofen  (ADVIL ,MOTRIN ) 800 MG tablet Take 1 tablet (800 mg total) by mouth 3 (three) times daily. 02/14/15   Sofia, Leslie K, PA-C  lidocaine  (LIDODERM ) 5 % Place 1 patch onto the skin daily. Remove & Discard patch within 12 hours or as directed by MD 07/14/23   Suellen Cantor A, PA-C  meloxicam  (MOBIC ) 7.5 MG tablet Take 1 tablet (7.5 mg total) by mouth daily. 07/14/23   Suellen Cantor A, PA-C  methocarbamol  (ROBAXIN ) 500 MG tablet Take 1 tablet (500 mg  total) by mouth every 6 (six) hours as needed for muscle spasms. 07/14/23   Suellen Cantor A, PA-C    Allergies: Patient has no known allergies.    Review of Systems  Constitutional: Negative.   HENT: Negative.    Eyes:  Positive for pain and discharge.  Respiratory: Negative.    Musculoskeletal: Negative.   Skin:  Positive for color change.  Neurological: Negative.   All other systems reviewed and are negative.   Updated Vital Signs BP (!) 144/94   Pulse 75   Temp 99 F (37.2 C) (Oral)   Resp 20   Ht 5' 11 (1.803 m)   Wt 109.8 kg   SpO2 97%   BMI 33.75 kg/m   Physical Exam Vitals and nursing note reviewed.  Constitutional:      General: He is not in acute distress.    Appearance: He is well-developed. He is not ill-appearing, toxic-appearing or diaphoretic.  HENT:     Head: Normocephalic and atraumatic.  Eyes:     Pupils: Pupils are equal, round, and reactive to light.     Comments: PERRLA, full range of motion without pain.  No conjunctival injection.  Negative fluorescein  uptake, negative Seidel sign. IOP 14,15.  Visual acuity 20/20, right eye, left eye, both eyes.  No nystagmus.  Upper lid periorbital edema, erythema.  Slight erythema inferior eyelid  Cardiovascular:     Rate and Rhythm:  Normal rate and regular rhythm.  Pulmonary:     Effort: Pulmonary effort is normal. No respiratory distress.  Abdominal:     General: There is no distension.     Palpations: Abdomen is soft.  Musculoskeletal:        General: Normal range of motion.     Cervical back: Normal range of motion and neck supple.  Skin:    General: Skin is warm and dry.  Neurological:     General: No focal deficit present.     Mental Status: He is alert and oriented to person, place, and time.     (all labs ordered are listed, but only abnormal results are displayed) Labs Reviewed - No data to display  EKG: None  Radiology: No results found.   Procedures   Medications Ordered in the  ED  tetracaine  (PONTOCAINE) 0.5 % ophthalmic solution 2 drop (2 drops Right Eye Given 01/26/24 1512)  fluorescein  ophthalmic strip 1 strip (1 strip Right Eye Given 01/26/24 2440)   48 year old here for evaluation of right eyelid pain and swelling as well as yellow crusting drainage.  He is afebrile, nonseptic, non-ill-appearing.  He has no changes to his vision.  No actual eye pain itself.  No headache, normal neuroexam.  No fluorescein  uptake, negative Seidel sign, IOP is within normal limits.  PERRLA, no pain with eye movements.  Exam is consistent likely blepharitis however does have some very minimal erythema to the inferior lateral lower eyelid.  Could possibly be developing some early preseptal cellulitis.  I have low suspicion for orbital cellulitis, acute angle glaucoma, corneal ulceration, abrasion, retinal detachment, uveitis, iritis, CRAO, demyelinating process.  Will treat for preseptal cellulitis/blepharitis discussed continued warm compresses to the eyelid, erythromycin  eye ointment follow-up with PCP/ophthalmology.  Do not feel he needs additional labs or imaging at this time.  The patient has been appropriately medically screened and/or stabilized in the ED. I have low suspicion for any other emergent medical condition which would require further screening, evaluation or treatment in the ED or require inpatient management.  Patient is hemodynamically stable and in no acute distress.  Patient able to ambulate in department prior to ED.  Evaluation does not show acute pathology that would require ongoing or additional emergent interventions while in the emergency department or further inpatient treatment.  I have discussed the diagnosis with the patient and answered all questions.  Pain is been managed while in the emergency department and patient has no further complaints prior to discharge.  Patient is comfortable with plan discussed in room and is stable for discharge at this time.  I have  discussed strict return precautions for returning to the emergency department.  Patient was encouraged to follow-up with PCP/specialist refer to at discharge.                                   Medical Decision Making Amount and/or Complexity of Data Reviewed External Data Reviewed: labs, radiology and notes.  Risk OTC drugs. Prescription drug management. Decision regarding hospitalization. Diagnosis or treatment significantly limited by social determinants of health.        Final diagnoses:  Periorbital cellulitis of right eye  Hordeolum externum of right upper eyelid  Acute bacterial conjunctivitis of right eye    ED Discharge Orders          Ordered    erythromycin  ophthalmic ointment  01/26/24 1541    amoxicillin -clavulanate (AUGMENTIN ) 875-125 MG tablet  Every 12 hours        01/26/24 1541               Joice Nazario A, PA-C 01/26/24 2125    Garrick Charleston, MD 01/27/24 2340
